# Patient Record
Sex: Female | Born: 1970 | Race: Black or African American | Hispanic: No | Marital: Single | Smoking: Former smoker
Health system: Southern US, Community
[De-identification: ages and names within clinical notes are randomized; demographics above are authoritative.]

## PROBLEM LIST (undated history)

## (undated) DIAGNOSIS — R51 Headache: Secondary | ICD-10-CM

## (undated) HISTORY — PX: CHOLECYSTECTOMY: SHX55

## (undated) HISTORY — PX: REDUCTION MAMMAPLASTY: SUR839

## (undated) HISTORY — PX: BREAST SURGERY: SHX581

## (undated) HISTORY — DX: Headache: R51

## (undated) SURGERY — BREAST REDUCTION WITH LIPOSUCTION
Anesthesia: General | Laterality: Bilateral

---

## 1998-01-20 DIAGNOSIS — R519 Headache, unspecified: Secondary | ICD-10-CM

## 1998-01-20 HISTORY — DX: Headache, unspecified: R51.9

## 2002-04-27 ENCOUNTER — Emergency Department (HOSPITAL_COMMUNITY): Admission: EM | Admit: 2002-04-27 | Discharge: 2002-04-27 | Payer: Self-pay | Admitting: Emergency Medicine

## 2002-04-27 ENCOUNTER — Encounter: Payer: Self-pay | Admitting: Emergency Medicine

## 2002-06-16 ENCOUNTER — Encounter: Payer: Self-pay | Admitting: *Deleted

## 2002-06-16 ENCOUNTER — Emergency Department (HOSPITAL_COMMUNITY): Admission: EM | Admit: 2002-06-16 | Discharge: 2002-06-16 | Payer: Self-pay | Admitting: *Deleted

## 2002-08-23 ENCOUNTER — Emergency Department (HOSPITAL_COMMUNITY): Admission: EM | Admit: 2002-08-23 | Discharge: 2002-08-23 | Payer: Self-pay | Admitting: Emergency Medicine

## 2002-08-24 ENCOUNTER — Ambulatory Visit (HOSPITAL_COMMUNITY): Admission: RE | Admit: 2002-08-24 | Discharge: 2002-08-24 | Payer: Self-pay | Admitting: Psychiatry

## 2002-08-24 ENCOUNTER — Encounter: Payer: Self-pay | Admitting: Emergency Medicine

## 2003-11-25 ENCOUNTER — Emergency Department (HOSPITAL_COMMUNITY): Admission: EM | Admit: 2003-11-25 | Discharge: 2003-11-25 | Payer: Self-pay | Admitting: Emergency Medicine

## 2004-01-23 ENCOUNTER — Ambulatory Visit: Payer: Self-pay | Admitting: Family Medicine

## 2004-07-24 ENCOUNTER — Ambulatory Visit: Payer: Self-pay | Admitting: Family Medicine

## 2004-10-24 ENCOUNTER — Ambulatory Visit: Payer: Self-pay | Admitting: Family Medicine

## 2004-10-29 ENCOUNTER — Ambulatory Visit (HOSPITAL_COMMUNITY): Admission: RE | Admit: 2004-10-29 | Discharge: 2004-10-29 | Payer: Self-pay | Admitting: Family Medicine

## 2004-11-06 ENCOUNTER — Emergency Department (HOSPITAL_COMMUNITY): Admission: EM | Admit: 2004-11-06 | Discharge: 2004-11-06 | Payer: Self-pay | Admitting: Emergency Medicine

## 2005-06-12 ENCOUNTER — Ambulatory Visit: Payer: Self-pay | Admitting: Family Medicine

## 2006-03-10 ENCOUNTER — Ambulatory Visit: Payer: Self-pay | Admitting: Family Medicine

## 2006-03-10 ENCOUNTER — Ambulatory Visit (HOSPITAL_COMMUNITY): Admission: RE | Admit: 2006-03-10 | Discharge: 2006-03-10 | Payer: Self-pay | Admitting: Family Medicine

## 2006-03-10 LAB — CONVERTED CEMR LAB: hCG, Beta Chain, Quant, S: 11444.3 milliintl units/mL

## 2006-04-20 ENCOUNTER — Ambulatory Visit: Payer: Self-pay | Admitting: Family Medicine

## 2006-04-20 LAB — CONVERTED CEMR LAB
BUN: 10 mg/dL (ref 6–23)
Basophils Absolute: 0 10*3/uL (ref 0.0–0.1)
Basophils Relative: 1 % (ref 0–1)
CO2: 24 meq/L (ref 19–32)
Calcium: 9.2 mg/dL (ref 8.4–10.5)
Chloride: 108 meq/L (ref 96–112)
Cholesterol: 106 mg/dL (ref 0–200)
Creatinine, Ser: 0.83 mg/dL (ref 0.40–1.20)
Eosinophils Absolute: 0.1 10*3/uL (ref 0.0–0.7)
Eosinophils Relative: 1 % (ref 0–5)
Glucose, Bld: 79 mg/dL (ref 70–99)
HCT: 35.8 % — ABNORMAL LOW (ref 36.0–46.0)
HDL: 47 mg/dL (ref >39)
Hemoglobin: 11.4 g/dL — ABNORMAL LOW (ref 12.0–15.0)
LDL Cholesterol: 48 mg/dL (ref 0–99)
Lymphocytes Relative: 32 % (ref 12–46)
Lymphs Abs: 1.9 10*3/uL (ref 0.7–3.3)
MCHC: 31.8 g/dL (ref 30.0–36.0)
MCV: 78 fL (ref 78.0–100.0)
Monocytes Absolute: 0.6 10*3/uL (ref 0.2–0.7)
Monocytes Relative: 10 % (ref 3–11)
Neutro Abs: 3.4 10*3/uL (ref 1.7–7.7)
Neutrophils Relative %: 57 % (ref 43–77)
Platelets: 288 10*3/uL (ref 150–400)
Potassium: 4.1 meq/L (ref 3.5–5.3)
RBC: 4.59 M/uL (ref 3.87–5.11)
RDW: 14.8 % — ABNORMAL HIGH (ref 11.5–14.0)
Sodium: 143 meq/L (ref 135–145)
TSH: 2.571 microintl units/mL (ref 0.350–5.50)
Total CHOL/HDL Ratio: 2.3
Triglycerides: 53 mg/dL (ref <150)
VLDL: 11 mg/dL (ref 0–40)
WBC: 6 10*3/uL (ref 4.0–10.5)
hCG, Beta Chain, Quant, S: 2.6 milliintl units/mL

## 2007-01-21 ENCOUNTER — Encounter: Payer: Self-pay | Admitting: Family Medicine

## 2007-07-16 ENCOUNTER — Encounter: Payer: Self-pay | Admitting: Family Medicine

## 2007-07-16 ENCOUNTER — Other Ambulatory Visit: Admission: RE | Admit: 2007-07-16 | Discharge: 2007-07-16 | Payer: Self-pay | Admitting: Family Medicine

## 2007-07-16 ENCOUNTER — Ambulatory Visit: Payer: Self-pay | Admitting: Family Medicine

## 2007-07-16 LAB — CONVERTED CEMR LAB
BUN: 7 mg/dL (ref 6–23)
Basophils Absolute: 0 10*3/uL (ref 0.0–0.1)
Basophils Relative: 0 % (ref 0–1)
CO2: 25 meq/L (ref 19–32)
Calcium: 8.9 mg/dL (ref 8.4–10.5)
Chloride: 106 meq/L (ref 96–112)
Cholesterol: 84 mg/dL (ref 0–200)
Creatinine, Ser: 0.72 mg/dL (ref 0.40–1.20)
Eosinophils Absolute: 0.1 10*3/uL (ref 0.0–0.7)
Eosinophils Relative: 1 % (ref 0–5)
Glucose, Bld: 84 mg/dL (ref 70–99)
HCT: 38.1 % (ref 36.0–46.0)
HDL: 50 mg/dL (ref >39)
Hemoglobin: 11.9 g/dL — ABNORMAL LOW (ref 12.0–15.0)
LDL Cholesterol: 24 mg/dL (ref 0–99)
Lymphocytes Relative: 28 % (ref 12–46)
Lymphs Abs: 1.5 10*3/uL (ref 0.7–4.0)
MCHC: 31.2 g/dL (ref 30.0–36.0)
MCV: 83.9 fL (ref 78.0–100.0)
Monocytes Absolute: 0.5 10*3/uL (ref 0.1–1.0)
Monocytes Relative: 8 % (ref 3–12)
Neutro Abs: 3.4 10*3/uL (ref 1.7–7.7)
Neutrophils Relative %: 62 % (ref 43–77)
Platelets: 264 10*3/uL (ref 150–400)
Potassium: 4.8 meq/L (ref 3.5–5.3)
RBC: 4.54 M/uL (ref 3.87–5.11)
RDW: 14.5 % (ref 11.5–15.5)
Sodium: 142 meq/L (ref 135–145)
Total CHOL/HDL Ratio: 1.7
Triglycerides: 49 mg/dL (ref <150)
VLDL: 10 mg/dL (ref 0–40)
WBC: 5.5 10*3/uL (ref 4.0–10.5)

## 2007-07-17 ENCOUNTER — Encounter: Payer: Self-pay | Admitting: Family Medicine

## 2007-07-17 LAB — CONVERTED CEMR LAB
Candida species: NEGATIVE
Chlamydia, DNA Probe: NEGATIVE
GC Probe Amp, Genital: NEGATIVE
Gardnerella vaginalis: POSITIVE — AB
Trichomonal Vaginitis: NEGATIVE

## 2007-07-27 ENCOUNTER — Ambulatory Visit: Payer: Self-pay | Admitting: Family Medicine

## 2007-07-27 ENCOUNTER — Ambulatory Visit (HOSPITAL_COMMUNITY): Admission: RE | Admit: 2007-07-27 | Discharge: 2007-07-27 | Payer: Self-pay | Admitting: Family Medicine

## 2007-12-24 ENCOUNTER — Ambulatory Visit: Payer: Self-pay | Admitting: Family Medicine

## 2007-12-24 DIAGNOSIS — F329 Major depressive disorder, single episode, unspecified: Secondary | ICD-10-CM

## 2007-12-27 ENCOUNTER — Encounter: Payer: Self-pay | Admitting: Family Medicine

## 2008-01-06 ENCOUNTER — Telehealth: Payer: Self-pay | Admitting: Family Medicine

## 2008-04-17 ENCOUNTER — Ambulatory Visit: Payer: Self-pay | Admitting: Family Medicine

## 2008-04-17 DIAGNOSIS — J309 Allergic rhinitis, unspecified: Secondary | ICD-10-CM | POA: Insufficient documentation

## 2008-04-18 ENCOUNTER — Encounter: Payer: Self-pay | Admitting: Family Medicine

## 2008-04-18 LAB — CONVERTED CEMR LAB
Free T4: 0.92 ng/dL (ref 0.89–1.80)
T3, Free: 2.9 pg/mL (ref 2.3–4.2)

## 2008-04-19 LAB — CONVERTED CEMR LAB: TSH: 4.592 microintl units/mL — ABNORMAL HIGH (ref 0.350–4.500)

## 2008-04-24 ENCOUNTER — Ambulatory Visit (HOSPITAL_COMMUNITY): Admission: RE | Admit: 2008-04-24 | Discharge: 2008-04-24 | Payer: Self-pay | Admitting: Family Medicine

## 2008-04-26 ENCOUNTER — Telehealth: Payer: Self-pay | Admitting: Family Medicine

## 2008-04-27 ENCOUNTER — Telehealth: Payer: Self-pay | Admitting: Family Medicine

## 2008-07-03 ENCOUNTER — Other Ambulatory Visit: Admission: RE | Admit: 2008-07-03 | Discharge: 2008-07-03 | Payer: Self-pay | Admitting: Family Medicine

## 2008-07-03 ENCOUNTER — Ambulatory Visit: Payer: Self-pay | Admitting: Family Medicine

## 2008-07-03 ENCOUNTER — Encounter: Payer: Self-pay | Admitting: Family Medicine

## 2008-07-03 DIAGNOSIS — E669 Obesity, unspecified: Secondary | ICD-10-CM

## 2008-07-03 DIAGNOSIS — N771 Vaginitis, vulvitis and vulvovaginitis in diseases classified elsewhere: Secondary | ICD-10-CM

## 2008-07-03 DIAGNOSIS — D259 Leiomyoma of uterus, unspecified: Secondary | ICD-10-CM

## 2008-07-03 DIAGNOSIS — R109 Unspecified abdominal pain: Secondary | ICD-10-CM | POA: Insufficient documentation

## 2008-07-03 LAB — CONVERTED CEMR LAB
BUN: 9 mg/dL (ref 6–23)
Basophils Absolute: 0 10*3/uL (ref 0.0–0.1)
Basophils Relative: 0 % (ref 0–1)
CO2: 22 meq/L (ref 19–32)
Calcium: 9.2 mg/dL (ref 8.4–10.5)
Chlamydia, DNA Probe: NEGATIVE
Chloride: 106 meq/L (ref 96–112)
Cholesterol: 103 mg/dL (ref 0–200)
Creatinine, Ser: 0.72 mg/dL (ref 0.40–1.20)
Eosinophils Absolute: 0.1 10*3/uL (ref 0.0–0.7)
Eosinophils Relative: 1 % (ref 0–5)
GC Probe Amp, Genital: NEGATIVE
Glucose, Bld: 86 mg/dL (ref 70–99)
HCT: 38.4 % (ref 36.0–46.0)
HDL: 45 mg/dL (ref >39)
Hemoglobin: 11.9 g/dL — ABNORMAL LOW (ref 12.0–15.0)
LDL Cholesterol: 45 mg/dL (ref 0–99)
Lymphocytes Relative: 27 % (ref 12–46)
Lymphs Abs: 1.8 10*3/uL (ref 0.7–4.0)
MCHC: 31 g/dL (ref 30.0–36.0)
MCV: 83.3 fL (ref 78.0–100.0)
Monocytes Absolute: 0.4 10*3/uL (ref 0.1–1.0)
Monocytes Relative: 6 % (ref 3–12)
Neutro Abs: 4.4 10*3/uL (ref 1.7–7.7)
Neutrophils Relative %: 65 % (ref 43–77)
Platelets: 243 10*3/uL (ref 150–400)
Potassium: 4.1 meq/L (ref 3.5–5.3)
RBC: 4.61 M/uL (ref 3.87–5.11)
RDW: 15.2 % (ref 11.5–15.5)
Sodium: 141 meq/L (ref 135–145)
Total CHOL/HDL Ratio: 2.3
Triglycerides: 66 mg/dL (ref <150)
VLDL: 13 mg/dL (ref 0–40)
WBC: 6.7 10*3/uL (ref 4.0–10.5)

## 2008-07-04 ENCOUNTER — Encounter: Payer: Self-pay | Admitting: Family Medicine

## 2008-07-04 LAB — CONVERTED CEMR LAB
Candida species: NEGATIVE
Gardnerella vaginalis: NEGATIVE
Trichomonal Vaginitis: NEGATIVE

## 2008-07-06 ENCOUNTER — Ambulatory Visit (HOSPITAL_COMMUNITY): Admission: RE | Admit: 2008-07-06 | Discharge: 2008-07-06 | Payer: Self-pay | Admitting: Family Medicine

## 2008-07-10 ENCOUNTER — Telehealth: Payer: Self-pay | Admitting: Family Medicine

## 2008-07-21 ENCOUNTER — Encounter: Payer: Self-pay | Admitting: Family Medicine

## 2008-08-30 ENCOUNTER — Encounter: Payer: Self-pay | Admitting: Family Medicine

## 2008-10-09 ENCOUNTER — Other Ambulatory Visit (HOSPITAL_COMMUNITY): Admission: RE | Admit: 2008-10-09 | Discharge: 2008-10-23 | Payer: Self-pay | Admitting: Psychiatry

## 2008-10-09 ENCOUNTER — Ambulatory Visit: Payer: Self-pay | Admitting: Psychiatry

## 2008-12-04 ENCOUNTER — Telehealth: Payer: Self-pay | Admitting: Family Medicine

## 2008-12-06 ENCOUNTER — Encounter (INDEPENDENT_AMBULATORY_CARE_PROVIDER_SITE_OTHER): Payer: Self-pay

## 2008-12-06 ENCOUNTER — Ambulatory Visit: Payer: Self-pay | Admitting: Family Medicine

## 2008-12-06 DIAGNOSIS — D509 Iron deficiency anemia, unspecified: Secondary | ICD-10-CM | POA: Insufficient documentation

## 2008-12-06 DIAGNOSIS — F411 Generalized anxiety disorder: Secondary | ICD-10-CM | POA: Insufficient documentation

## 2008-12-06 LAB — CONVERTED CEMR LAB: TSH: 3.667 microintl units/mL (ref 0.350–4.500)

## 2008-12-07 ENCOUNTER — Telehealth: Payer: Self-pay | Admitting: Family Medicine

## 2008-12-11 ENCOUNTER — Telehealth: Payer: Self-pay | Admitting: Family Medicine

## 2009-02-06 ENCOUNTER — Encounter (INDEPENDENT_AMBULATORY_CARE_PROVIDER_SITE_OTHER): Payer: Self-pay | Admitting: *Deleted

## 2009-08-15 ENCOUNTER — Ambulatory Visit: Payer: Self-pay | Admitting: Family Medicine

## 2009-10-24 ENCOUNTER — Encounter: Payer: Self-pay | Admitting: Physician Assistant

## 2009-10-24 ENCOUNTER — Ambulatory Visit: Payer: Self-pay | Admitting: Family Medicine

## 2009-10-24 ENCOUNTER — Other Ambulatory Visit: Admission: RE | Admit: 2009-10-24 | Discharge: 2009-10-24 | Payer: Self-pay | Admitting: Family Medicine

## 2009-10-24 DIAGNOSIS — M549 Dorsalgia, unspecified: Secondary | ICD-10-CM | POA: Insufficient documentation

## 2009-10-25 ENCOUNTER — Encounter: Payer: Self-pay | Admitting: Family Medicine

## 2009-10-25 LAB — CONVERTED CEMR LAB
Candida species: NEGATIVE
Chlamydia, DNA Probe: NEGATIVE
GC Probe Amp, Genital: NEGATIVE
Gardnerella vaginalis: NEGATIVE

## 2009-10-26 ENCOUNTER — Encounter: Payer: Self-pay | Admitting: Family Medicine

## 2009-10-26 LAB — CONVERTED CEMR LAB: Pap Smear: NEGATIVE

## 2009-12-25 ENCOUNTER — Encounter: Payer: Self-pay | Admitting: Family Medicine

## 2010-02-21 NOTE — Letter (Signed)
Summary: Work Excuse  [facility]  [address]   [place], [place] [address]   Phone: [phone]  Fax: [phone]    Today's Date: October 24, 2009  Name of Patient: Erin Forbes  The above named patient had a medical visit today. Please take this into consideration when reviewing the time away from work/school.    Special Instructions:  [ * ] None  [  ] To be off the remainder of today, returning to the normal work / school schedule tomorrow.  [  ] To be off until the next scheduled appointment on ______________________.  [  ] Other ________________________________________________________________ ________________________________________________________________________   Sincerely yours,   Syliva Overman, MD

## 2010-02-21 NOTE — Letter (Signed)
Summary: 2nd missed letter  2nd missed letter   Imported By: Lind Guest 12/26/2009 10:58:20  _____________________________________________________________________  External Attachment:    Type:   Image     Comment:   External Document

## 2010-02-21 NOTE — Letter (Signed)
Summary: labs  labs   Imported By: Lind Guest 07/10/2009 16:37:11  _____________________________________________________________________  External Attachment:    Type:   Image     Comment:   External Document

## 2010-02-21 NOTE — Progress Notes (Signed)
Summary: Office Visit  Office Visit   Imported By: Lind Guest 01/22/2009 09:43:49  _____________________________________________________________________  External Attachment:    Type:   Image     Comment:   External Document

## 2010-02-21 NOTE — Letter (Signed)
Summary: x rays  x rays   Imported By: Lind Guest 07/10/2009 16:39:21  _____________________________________________________________________  External Attachment:    Type:   Image     Comment:   External Document

## 2010-02-21 NOTE — Letter (Signed)
Summary: office notes  office notes   Imported By: Lind Guest 07/10/2009 16:38:50  _____________________________________________________________________  External Attachment:    Type:   Image     Comment:   External Document

## 2010-02-21 NOTE — Assessment & Plan Note (Signed)
Summary: physical   Vital Signs:  Patient profile:   40 year old female Menstrual status:  irregular Height:      66 inches Weight:      189.25 pounds BMI:     30.66 O2 Sat:      98 % on Room air Pulse rate:   100 / minute Pulse rhythm:   regular Resp:     16 per minute BP sitting:   120 / 80  (left arm)  Vitals Entered By: Adella Hare LPN (October 24, 2009 11:18 AM)  Nutrition Counseling: Patient's BMI is greater than 25 and therefore counseled on weight management options.  O2 Flow:  Room air CC: physical Is Patient Diabetic? No Pain Assessment Patient in pain? no        CC:  physical.  History of Present Illness: Reports  that she has been doing well. She is still not exercising daily, on average jhus twice per week. She has noted reduced apetite on the med and wants to cntinue this.She has no adverse s/e Denies recent fever or chills. Denies sinus pressure, nasal congestion , ear pain or sore throat. Denies chest congestion, or cough productive of sputum. Denies chest pain, palpitations, PND, orthopnea or leg swelling. Denies abdominal pain, nausea, vomitting, diarrhea or constipation. Denies change in bowel movements or bloody stool. Denies dysuria , frequency, incontinence or hesitancy.  Denies headaches, vertigo, seizures. Denies depression, anxiety or insomnia. Denies  rash, lesions, or itch.     Current Medications (verified): 1)  Phentermine Hcl 37.5 Mg Caps (Phentermine Hcl) .... Take 1 Tablet By Mouth Once A Day  Allergies (verified): No Known Drug Allergies  Review of Systems      See HPI MS:  Complains of low back pain; 3 week h/o disabling low back pain. Endo:  Denies excessive thirst and excessive urination. Heme:  Denies abnormal bruising and bleeding. Allergy:  Denies hives or rash and itching eyes.  Physical Exam  General:  Well-developed,well-nourished,in no acute distress; alert,appropriate and cooperative throughout  examination Head:  Normocephalic and atraumatic without obvious abnormalities. No apparent alopecia or balding. Eyes:  No corneal or conjunctival inflammation noted. EOMI. Perrla. Funduscopic exam benign, without hemorrhages, exudates or papilledema. Vision grossly normal. Ears:  External ear exam shows no significant lesions or deformities.  Otoscopic examination reveals bilateral cerumen impaction,  Hearing is grossly normal bilaterally. Nose:  External nasal examination shows no deformity or inflammation. Nasal mucosa are pink and moist without lesions or exudates. Mouth:  Oral mucosa and oropharynx without lesions or exudates.  Teeth in good repair. Neck:  No deformities, masses, or tenderness noted. Chest Wall:  No deformities, masses, or tenderness noted. Breasts:  No mass, nodules, thickening, tenderness, bulging, retraction, inflamation, nipple discharge or skin changes noted.   Lungs:  Normal respiratory effort, chest expands symmetrically. Lungs are clear to auscultation, no crackles or wheezes. Heart:  Normal rate and regular rhythm. S1 and S2 normal without gallop, murmur, click, rub or other extra sounds. Abdomen:  Bowel sounds positive,abdomen soft and non-tender without masses, organomegaly or hernias noted. Genitalia:  Normal introitus for age, no external lesions, clear vaginal discharge, mucosa pink and moist, no vaginal or cervical lesions, no vaginal atrophy, no friaility or hemorrhage, normal uterus size and position, no adnexal masses or tenderness Msk:  No deformity or scoliosis noted of thoracic or lumbar spine.   Pulses:  R and L carotid,radial,femoral,dorsalis pedis and posterior tibial pulses are full and equal bilaterally Extremities:  decreased rOM thoracolumbar spine with spasm Neurologic:  No cranial nerve deficits noted. Station and gait are normal. Plantar reflexes are down-going bilaterally. DTRs are symmetrical throughout. Sensory, motor and coordinative functions  appear intact. Skin:  Intact without suspicious lesions or rashes Cervical Nodes:  No lymphadenopathy noted Axillary Nodes:  No palpable lymphadenopathy Inguinal Nodes:  No significant adenopathy Psych:  Cognition and judgment appear intact. Alert and cooperative with normal attention span and concentration. No apparent delusions, illusions, hallucinations   Impression & Recommendations:  Problem # 1:  BACK PAIN (ICD-724.5) Assessment Deteriorated  Her updated medication list for this problem includes:    Ibuprofen 800 Mg Tabs (Ibuprofen) .Marland Kitchen... Take 1 tablet by mouth three times a day  Orders: Radiology other (Radiology Other) Depo- Medrol 80mg  (J1040) Ketorolac-Toradol 15mg  (O1308) Admin of Therapeutic Inj  intramuscular or subcutaneous (65784)  Problem # 2:  OVERWEIGHT (ICD-278.02) Assessment: Improved  Ht: 66 (10/24/2009)   Wt: 189.25 (10/24/2009)   BMI: 30.66 (10/24/2009) therapeutic lifestyle change discussed and encouraged  Problem # 3:  Preventive Health Care (ICD-V70.0) pAP SENT. CONDOM USE DISCUSSED, ALSO SEATBELT USE  Complete Medication List: 1)  Ibuprofen 800 Mg Tabs (Ibuprofen) .... Take 1 tablet by mouth three times a day 2)  Phentermine Hcl 37.5 Mg Tabs (Phentermine hcl) .... Take 1 tablet by mouth once a day  Other Orders: T-Wet Prep by Molecular Probe [id number]) T-Chlamydia & GC Probe, Genital ([id number]) Pap Smear ([address]40)  Patient Instructions: 1)  Please schedule a follow-up appointment in 2 months. 2)  It is important that you exercise regularly at least 40 minutes 6 times a week. If you develop chest pain, have severe difficulty breathing, or feel very tired , stop exercising immediately and seek medical attention. 3)  You need to lose weight. Consider a lower calorie diet and regular exercise. congrats on  6 pound weight loss.Marland Kitchen 4)  you will get injections for your back, back strengthening exercises, watch your posture and care with  heels. 5)  meds are also sent in for the back pain 6)  your urine will be checked for infection Prescriptions: PHENTERMINE HCL 37.5 MG TABS (PHENTERMINE HCL) Take 1 tablet by mouth once a day  #30 x 0   Entered and Authorized by:   Syliva Overman MD   Signed by:   Syliva Overman MD on 10/24/2009   Method used:   Printed then faxed to ...       [facility] (retail)       [address]       [place], [place]  [address]       Ph: [phone]       Fax: [phone]   RxID:   [id number] IBUPROFEN 800 MG TABS (IBUPROFEN) Take 1 tablet by mouth three times a day  #30 x 0   Entered and Authorized by:   Syliva Overman MD   Signed by:   Syliva Overman MD on 10/24/2009   Method used:   Electronically to        [facility] (retail)       [address]       [place], [place]  [address]       Ph: [phone]       Fax: [phone]   RxID:   [id number]      Medication Administration  Injection # 1:  Medication: Depo- Medrol 80mg     Diagnosis: BACK PAIN (ICD-724.5)    Route: IM    Site: RUOQ gluteus    Exp Date: 05/2010    Lot #: [id number]    Mfr: Pharmacia    Patient tolerated injection without complications    Given by: Adella Hare LPN (October 24, 2009 12:08 PM)  Injection # 2:    Medication: Ketorolac-Toradol 15mg     Diagnosis: BACK PAIN (ICD-724.5)    Route: IM    Site: LUOQ gluteus    Exp Date: 03/21/2011    Lot #: [id number]    Mfr: novaplus    Comments: toradol 60mg  given    Patient tolerated injection without complications    Given by: Adella Hare LPN (October 24, 2009 12:09 PM)  Orders Added: 1)  Est. Patient 18-39 years [99395] 2)  Radiology other [Radiology Other] 3)  Depo- Medrol 80mg  [J1040] 4)  Ketorolac-Toradol 15mg  [J1885] 5)  Admin of Therapeutic Inj  intramuscular or subcutaneous [96372] 6)  T-Wet Prep by Molecular Probe [[id number]] 7)  T-Chlamydia & GC Probe, Genital  [[id number]] 8)  Pap Smear [86578]  Appended Document: physical vision screen R 20/25 L 20/25 B 20/25

## 2010-02-21 NOTE — Letter (Signed)
Summary: 1st Missed Appt.  [facility]  [address]   [place], [place] [address]   Phone: [phone]  Fax: [phone]    February 06, 2009  MRN: [id number]  ALIEAH BRINTON [address] [place], [place]  [address]  Dear Ms. Markman,  At [facility], we make every attempt to fit patients into our schedule by reserving several appointment slots for same-day appointments.  However, we cannot always make appointments for patients the same day they are calling.  At the end of the day, we look back at our schedule and find that because of last-minute cancellations and patients not showing up for their scheduled appointments, we have several appointment slots that are left open and could have been used by another person who really needed it.  In the past, you may have been one of the patients who could not get in when you needed to.  But recently, you were one of the patients with an appointment that you didn't show up for or canceled too late for [place] to fill it.  We choose not to charge no-show or last minute cancellation fees to our patients, like many other offices do.  We do not wish to institute that policy and hope we never have to.  However, we kindly request that you assist [place] by providing at least 24 hours' notice if you can't make your appointment.  If no-shows or late cancellations become habitual (i.e. Three or more in a one-year period), we may terminate the physician-patient relationship.    Thank you for your consideration and cooperation.   Altamease Oiler

## 2010-02-21 NOTE — Letter (Signed)
Summary: history and physical  history and physical   Imported By: Lind Guest 07/10/2009 16:36:42  _____________________________________________________________________  External Attachment:    Type:   Image     Comment:   External Document

## 2010-02-21 NOTE — Letter (Signed)
Summary: Letter  Letter   Imported By: Lind Guest 10/26/2009 16:28:50  _____________________________________________________________________  External Attachment:    Type:   Image     Comment:   External Document

## 2010-02-21 NOTE — Letter (Signed)
Summary: demo  demo   Imported By: Lind Guest 07/10/2009 16:36:00  _____________________________________________________________________  External Attachment:    Type:   Image     Comment:   External Document

## 2010-02-21 NOTE — Letter (Signed)
Summary: phone notes  phone notes   Imported By: Lind Guest 07/10/2009 16:34:55  _____________________________________________________________________  External Attachment:    Type:   Image     Comment:   External Document

## 2010-02-21 NOTE — Assessment & Plan Note (Signed)
Summary: ov   Vital Signs:  Patient profile:   40 year old female Menstrual status:  irregular Height:      66 inches Weight:      195 pounds BMI:     31.59 O2 Sat:      97 % Pulse rate:   100 / minute Pulse rhythm:   regular Resp:     16 per minute BP sitting:   110 / 80  (left arm) Cuff size:   large  Vitals Entered By: Everitt Amber LPN (August 15, 2009 11:25 AM)  Nutrition Counseling: Patient's BMI is greater than 25 and therefore counseled on weight management options. CC: Follow up visit   CC:  Follow up visit.  History of Present Illness: Reports  that she has been doing well. Denies recent fever or chills. Denies sinus pressure, nasal congestion , ear pain or sore throat. Denies chest congestion, or cough productive of sputum. Denies chest pain, palpitations, PND, orthopnea or leg swelling. Denies abdominal pain, nausea, vomitting, diarrhea or constipation. Denies change in bowel movements or bloody stool. Denies dysuria , frequency, incontinence or hesitancy. Denies  joint pain, swelling, or reduced mobility. Denies headaches, vertigo, seizures. Denies depression, she does note some anxiety and difficulty with sleep esp perimenstrually. She ahs not been exercising regularly , but intends to start,a nd wants n apetite suppressant to help with weight loss. Denies  rash, lesions, or itch.      Current Medications (verified): 1)  Alprazolam 0.25 Mg Tabs (Alprazolam) .... Take 1 Tablet By Mouth Once A Day As Needed  Allergies (verified): No Known Drug Allergies  Review of Systems      See HPI General:  Complains of fatigue and sleep disorder. Eyes:  Denies blurring and discharge. Endo:  Denies cold intolerance, excessive hunger, excessive thirst, excessive urination, heat intolerance, polyuria, and weight change. Heme:  Denies abnormal bruising and bleeding. Allergy:  Denies hives or rash and itching eyes.  Physical Exam  General:  Well-developed,obese,in no  acute distress; alert,appropriate and cooperative throughout examination HEENT: No facial asymmetry,  EOMI, No sinus tenderness, TM's Clear, oropharynx  pink and moist.   Chest: Clear to auscultation bilaterally.  CVS: S1, S2, No murmurs, No S3.   Abd: Soft, Nontender.  MS: Adequate ROM spine, hips, shoulders and knees.  Ext: No edema.   CNS: CN 2-12 intact, power tone and sensation normal throughout.   Skin: Intact, no visible lesions or rashes.  Psych: Good eye contact, normal affect.  Memory intact, not anxious or depressed appearing.    Impression & Recommendations:  Problem # 1:  FATIGUE (ICD-780.79) Assessment Deteriorated  Orders: T-Basic Metabolic Panel [id number]) T-CBC w/Diff [id number]) T-TSH ([id number])  Problem # 2:  GENERALIZED ANXIETY DISORDER (ICD-300.02) Assessment: Improved  The following medications were removed from the medication list:    Alprazolam 0.25 Mg Tabs (Alprazolam) .Marland Kitchen... Take 1 tablet by mouth once a day as needed  Problem # 3:  OVERWEIGHT (ICD-278.02) Assessment: Deteriorated  Orders: T-Lipid Profile ([id number])  Ht: 66 (08/15/2009)   Wt: 195 (08/15/2009)   BMI: 31.59 (08/15/2009)  Complete Medication List: 1)  Phentermine Hcl 37.5 Mg Caps (Phentermine hcl) .... Take 1 tablet by mouth once a day  Other Orders: T-Vitamin D (25-Hydroxy) ([id number])  Patient Instructions: 1)  CPE in 2 months, Nurse visit mnext week for ear flush 2)  It is important that you exercise regularly at least 20 minutes 5 times a week. If you develop chest pain, have severe  difficulty breathing, or feel very tired , stop exercising immediately and seek medical attention. 3)  You need to lose weight. Consider a lower calorie diet and regular exercise.  4)  BMP prior to visit, ICD-9: 5)  Lipid Panel prior to visit, ICD-9:  fastingg asap 6)  TSH prior to visit, ICD-9: 7)  CBC w/ Diff prior to visit, ICD-9: 8)  Vitamin D Prescriptions: PHENTERMINE HCL  37.5 MG CAPS (PHENTERMINE HCL) Take 1 tablet by mouth once a day  #30 x 0   Entered and Authorized by:   Syliva Overman MD   Signed by:   Syliva Overman MD on 08/15/2009   Method used:   Printed then faxed to ...       [facility] [id number] (retail)       [address]       [place], [place]  [address]       Ph: [phone] or [phone]       Fax: [phone]   RxID:   [id number]

## 2010-02-21 NOTE — Letter (Signed)
Summary: misc  misc   Imported By: Lind Guest 07/10/2009 16:37:42  _____________________________________________________________________  External Attachment:    Type:   Image     Comment:   External Document

## 2010-02-21 NOTE — Letter (Signed)
Summary: consults  consults   Imported By: Lind Guest 07/10/2009 16:35:25  _____________________________________________________________________  External Attachment:    Type:   Image     Comment:   External Document

## 2011-07-18 ENCOUNTER — Emergency Department (HOSPITAL_COMMUNITY): Payer: Managed Care, Other (non HMO)

## 2011-07-18 ENCOUNTER — Emergency Department (HOSPITAL_COMMUNITY)
Admission: EM | Admit: 2011-07-18 | Discharge: 2011-07-18 | Disposition: A | Discharge status: Home / Self Care | Payer: Managed Care, Other (non HMO) | Attending: Emergency Medicine | Admitting: Emergency Medicine

## 2011-07-18 ENCOUNTER — Telehealth: Payer: Self-pay | Admitting: Family Medicine

## 2011-07-18 ENCOUNTER — Encounter (HOSPITAL_COMMUNITY): Payer: Self-pay | Admitting: *Deleted

## 2011-07-18 DIAGNOSIS — R42 Dizziness and giddiness: Secondary | ICD-10-CM | POA: Insufficient documentation

## 2011-07-18 DIAGNOSIS — R079 Chest pain, unspecified: Secondary | ICD-10-CM | POA: Insufficient documentation

## 2011-07-18 DIAGNOSIS — R0602 Shortness of breath: Secondary | ICD-10-CM | POA: Insufficient documentation

## 2011-07-18 DIAGNOSIS — R51 Headache: Secondary | ICD-10-CM | POA: Insufficient documentation

## 2011-07-18 LAB — CBC
MCH: 26.3 pg (ref 26.0–34.0)
MCV: 79.7 fL (ref 78.0–100.0)
Platelets: 268 10*3/uL (ref 150–400)
RDW: 14 % (ref 11.5–15.5)
WBC: 5.6 10*3/uL (ref 4.0–10.5)

## 2011-07-18 LAB — CARDIAC PANEL(CRET KIN+CKTOT+MB+TROPI): Total CK: 101 U/L (ref 7–177)

## 2011-07-18 LAB — BASIC METABOLIC PANEL
Calcium: 9.9 mg/dL (ref 8.4–10.5)
Creatinine, Ser: 0.71 mg/dL (ref 0.50–1.10)
GFR calc Af Amer: >90 mL/min (ref >90)
GFR calc non Af Amer: >90 mL/min (ref >90)

## 2011-07-18 MED ORDER — OMEPRAZOLE 20 MG PO CPDR: 20.0000 mg | DELAYED_RELEASE_CAPSULE | Freq: Every day | ORAL | Status: DC

## 2011-07-18 MED ORDER — GI COCKTAIL ~~LOC~~
30.0000 mL | Freq: Once | ORAL | Status: AC
  Administered 2011-07-18: 30 mL via ORAL
  Filled 2011-07-18: qty 30

## 2011-07-18 MED ORDER — IOHEXOL 350 MG/ML SOLN
100.0000 mL | Freq: Once | INTRAVENOUS | Status: AC | PRN
  Administered 2011-07-18: 100 mL via INTRAVENOUS

## 2011-07-18 NOTE — ED Notes (Signed)
Headache, rt  side of head, onset yesterday, and feels "off balance".  Chest pain this am.  Nausea, sl sob.

## 2011-07-18 NOTE — ED Provider Notes (Signed)
History   This chart was scribed for Glynn Octave, MD by Shari Heritage. The patient was seen in room APA09/APA09. Patient's care was started at 1117.     CSN: [id number]  Arrival date & time 07/18/11  1117   First MD Initiated Contact with Patient 07/18/11 1207      Chief Complaint  Patient presents with  . Chest Pain    (Consider location/radiation/quality/duration/timing/severity/associated sxs/prior treatment) HPI Erin Forbes is a 41 y.o. female who presents to the Emergency Department complaining of a sudden onset chest pain that began 4 hours ago and a recurrent temporal HA onset 1 month ago. Associated symptoms include dizziness, nausea, heaviness in her left arm, and SOB. Patient is experiencing these symptoms right now. Patient describes her headache as throbbing and pressure. Patient says that light and noise don't bother her, but she has experienced ringing in her ears. Patient says that her HAs come on gradually. Patient usually doesn't take any medications to relieve HAs. Patient denies h/o HTN, diabetes, heart problems. Patient has never had a stress test. Patient has never smoked. Patient is not currently taking any medications.  PCP - Lodema Hong  History reviewed. No pertinent past medical history.  Past Surgical History  Procedure Date  . Cesarean section     History reviewed. No pertinent family history.  History  Substance Use Topics  . Smoking status: Never Smoker   . Smokeless tobacco: Not on file  . Alcohol Use: Yes    OB History    Grav Para Term Preterm Abortions TAB SAB Ect Mult Living                  Review of Systems A complete 10 system review of systems was obtained and all systems are negative except as noted in the HPI and PMH.   Allergies  Review of patient's allergies indicates no known allergies.  Home Medications   Current Outpatient Rx  Name Route Sig Dispense Refill  . OMEPRAZOLE 20 MG PO CPDR Oral Take 1 capsule (20 mg  total) by mouth daily. 30 capsule 0    BP 112/73  Pulse 63  Temp 98.2 F (36.8 C) (Oral)  Resp 14  Ht 5\' 6"  (1.676 m)  Wt 194 lb (87.998 kg)  BMI 31.31 kg/m2  SpO2 99%  LMP 07/02/2011  Physical Exam  Nursing note and vitals reviewed. Constitutional: She is oriented to person, place, and time. She appears well-developed and well-nourished.  HENT:  Head: Normocephalic and atraumatic.  Eyes: Conjunctivae and EOM are normal. Pupils are equal, round, and reactive to light.  Neck: Normal range of motion. Neck supple.  Cardiovascular: Normal rate, regular rhythm and normal heart sounds.   No murmur heard. Pulmonary/Chest: Effort normal and breath sounds normal. No respiratory distress. She has no wheezes.       No chest pain to palpation.  Abdominal: Soft. Bowel sounds are normal.  Musculoskeletal: Normal range of motion.  Neurological: She is alert and oriented to person, place, and time.  Skin: Skin is warm and dry.  Psychiatric: She has a normal mood and affect.    ED Course  Procedures (including critical care time) DIAGNOSTIC STUDIES: Oxygen Saturation is 100% on room air, normal by my interpretation.    COORDINATION OF CARE: 12:14PM- Patient informed of current plan for treatment and evaluation and agrees with plan at this time.     Labs Reviewed  BASIC METABOLIC PANEL - Abnormal; Notable for the following:  Potassium 3.1 (*)     All other components within normal limits  D-DIMER, QUANTITATIVE - Abnormal; Notable for the following:    D-Dimer, Quant 0.50 (*)     All other components within normal limits  CBC  CARDIAC PANEL(CRET KIN+CKTOT+MB+TROPI)   Dg Chest 2 View  07/18/2011  *RADIOLOGY REPORT*  Clinical Data: Chest and left arm pain.  Nausea.  Dizziness.  CHEST - 2 VIEW  Comparison:  11/06/2004  Findings:  The heart size and mediastinal contours are within normal limits.  Both lungs are clear.  The visualized skeletal structures are unremarkable.  IMPRESSION:  No active cardiopulmonary disease.  Original Report Authenticated By: Danae Orleans, M.D.   Ct Angio Chest W/cm &/or Wo Cm  07/18/2011  *RADIOLOGY REPORT*  Clinical Data: Chest pain  CT ANGIOGRAPHY CHEST  Technique:  Multidetector CT imaging of the chest using the standard protocol during bolus administration of intravenous contrast. Multiplanar reconstructed images including MIPs were obtained and reviewed to evaluate the vascular anatomy.  Contrast: OMNIPAQUE IOHEXOL 350 MG/ML SOLN  Comparison: 11/06/2004  Findings: The pulmonary arteries are patent.  No evidence for acute embolus. There is no supraclavicular or axillary adenopathy.  No mediastinal or hilar adenopathy.  No pericardial or pleural effusion.  No airspace consolidation.  No suspicious pulmonary parenchymal nodule or mass identified.  No interstitial edema.  The tracheobronchial tree appears normal.  Review of the visualized bony structures is unremarkable.  Limited imaging through the upper abdomen is unremarkable.  IMPRESSION:  1.  No acute cardiopulmonary abnormalities.  No evidence for pulmonary embolus.  Original Report Authenticated By: Rosealee Albee, M.D.     1. Chest pain       MDM  Presents from work with 4 hours of constant chest pressure, headache, SOB, nausea.  Started at 830 am.  States has been having frequent headaches for the past month.  Atypical for ACS. No cardiac risk factors.  EKG nonischemic, troponin negative.  Ddimer slightly elevated.  CTPE negative.  Symptoms have resolved in ED.  Patient joking with family.  Reassured that symptoms do not appear to be cardiac.  Stable for outpatient followup.   Date: 07/18/2011  Rate: 63  Rhythm: normal sinus rhythm  QRS Axis: normal  Intervals: normal  ST/T Wave abnormalities: normal  Conduction Disutrbances:none  Narrative Interpretation:   Old EKG Reviewed: unchanged     I personally performed the services described in this documentation, which  was scribed in my presence.  The recorded information has been reviewed and considered.    Glynn Octave, MD 07/18/11 [phone]

## 2011-07-18 NOTE — Discharge Instructions (Signed)
Chest Pain (Nonspecific) There is no evidence of heart attack or blood clot in the lung.  Follow up with your doctor.  Return to the ED if you develop new or worsening symptoms. It is often hard to give a specific diagnosis for the cause of chest pain. There is always a chance that your pain could be related to something serious, such as a heart attack or a blood clot in the lungs. You need to follow up with your caregiver for further evaluation. CAUSES   Heartburn.   Pneumonia or bronchitis.   Anxiety or stress.   Inflammation around your heart (pericarditis) or lung (pleuritis or pleurisy).   A blood clot in the lung.   A collapsed lung (pneumothorax). It can develop suddenly on its own (spontaneous pneumothorax) or from injury (trauma) to the chest.   Shingles infection (herpes zoster virus).  The chest wall is composed of bones, muscles, and cartilage. Any of these can be the source of the pain.  The bones can be bruised by injury.   The muscles or cartilage can be strained by coughing or overwork.   The cartilage can be affected by inflammation and become sore (costochondritis).  DIAGNOSIS  Lab tests or other studies, such as X-rays, electrocardiography, stress testing, or cardiac imaging, may be needed to find the cause of your pain.  TREATMENT   Treatment depends on what may be causing your chest pain. Treatment may include:   Acid blockers for heartburn.   Anti-inflammatory medicine.   Pain medicine for inflammatory conditions.   Antibiotics if an infection is present.   You may be advised to change lifestyle habits. This includes stopping smoking and avoiding alcohol, caffeine, and chocolate.   You may be advised to keep your head raised (elevated) when sleeping. This reduces the chance of acid going backward from your stomach into your esophagus.   Most of the time, nonspecific chest pain will improve within 2 to 3 days with rest and mild pain medicine.  HOME  CARE INSTRUCTIONS   If antibiotics were prescribed, take your antibiotics as directed. Finish them even if you start to feel better.   For the next few days, avoid physical activities that bring on chest pain. Continue physical activities as directed.   Do not smoke.   Avoid drinking alcohol.   Only take over-the-counter or prescription medicine for pain, discomfort, or fever as directed by your caregiver.   Follow your caregiver's suggestions for further testing if your chest pain does not go away.   Keep any follow-up appointments you made. If you do not go to an appointment, you could develop lasting (chronic) problems with pain. If there is any problem keeping an appointment, you must call to reschedule.  SEEK MEDICAL CARE IF:   You think you are having problems from the medicine you are taking. Read your medicine instructions carefully.   Your chest pain does not go away, even after treatment.   You develop a rash with blisters on your chest.  SEEK IMMEDIATE MEDICAL CARE IF:   You have increased chest pain or pain that spreads to your arm, neck, jaw, back, or abdomen.   You develop shortness of breath, an increasing cough, or you are coughing up blood.   You have severe back or abdominal pain, feel nauseous, or vomit.   You develop severe weakness, fainting, or chills.   You have a fever.  THIS IS AN EMERGENCY. Do not wait to see if the pain will  go away. Get medical help at once. Call your local emergency services (911 in [place] Do not drive yourself to the hospital. MAKE SURE YOU:   Understand these instructions.   Will watch your condition.   Will get help right away if you are not doing well or get worse.  Document Released: 10/16/2004 Document Revised: 12/26/2010 Document Reviewed: 08/12/2007 [facility] Patient Information 2012 [place], [place].

## 2011-07-18 NOTE — Telephone Encounter (Signed)
It is ok for me to pick her back up

## 2011-07-22 ENCOUNTER — Encounter: Payer: Self-pay | Admitting: Family Medicine

## 2011-07-22 ENCOUNTER — Telehealth: Payer: Self-pay | Admitting: Family Medicine

## 2011-07-22 ENCOUNTER — Other Ambulatory Visit: Payer: Self-pay | Admitting: Family Medicine

## 2011-07-22 ENCOUNTER — Ambulatory Visit (INDEPENDENT_AMBULATORY_CARE_PROVIDER_SITE_OTHER): Payer: Managed Care, Other (non HMO) | Admitting: Family Medicine

## 2011-07-22 ENCOUNTER — Other Ambulatory Visit: Payer: Self-pay

## 2011-07-22 VITALS — BP 148/96 | HR 76 | Resp 18 | Ht 66.0 in | Wt 195.0 lb

## 2011-07-22 DIAGNOSIS — R5381 Other malaise: Secondary | ICD-10-CM

## 2011-07-22 DIAGNOSIS — G43009 Migraine without aura, not intractable, without status migrainosus: Secondary | ICD-10-CM | POA: Insufficient documentation

## 2011-07-22 DIAGNOSIS — Z13220 Encounter for screening for lipoid disorders: Secondary | ICD-10-CM

## 2011-07-22 DIAGNOSIS — F3289 Other specified depressive episodes: Secondary | ICD-10-CM

## 2011-07-22 DIAGNOSIS — R51 Headache: Secondary | ICD-10-CM

## 2011-07-22 DIAGNOSIS — Z139 Encounter for screening, unspecified: Secondary | ICD-10-CM

## 2011-07-22 DIAGNOSIS — F329 Major depressive disorder, single episode, unspecified: Secondary | ICD-10-CM

## 2011-07-22 DIAGNOSIS — I10 Essential (primary) hypertension: Secondary | ICD-10-CM

## 2011-07-22 DIAGNOSIS — R5383 Other fatigue: Secondary | ICD-10-CM

## 2011-07-22 DIAGNOSIS — E663 Overweight: Secondary | ICD-10-CM

## 2011-07-22 MED ORDER — TRIAMTERENE-HCTZ 37.5-25 MG PO TABS: ORAL_TABLET | ORAL | Status: DC

## 2011-07-22 NOTE — Progress Notes (Signed)
  Subjective:    Patient ID: Erin Forbes, female    DOB: October 18, 1970, 41 y.o.   MRN: [id number]  HPI The PT is here to re establish care and re-evaluation of chronic medical conditions, medication management and review of any available recent lab and radiology data.  Preventive health is updated, specifically  Cancer screening and Immunization.   Rxcently seen in the Ed for chest pain, at that time her blood pressure was high Has a 1 month h/o headaches, increased stress, poor sleep, excessive symptoms of depression, overwhelmed with financial responsibilities which she is unable to meet and poor living conditions. Not suicidal or homicidal, no interest in medication or therapy     Review of Systems See HPI Denies recent fever or chills. Denies sinus pressure, nasal congestion, ear pain or sore throat. Denies chest congestion, productive cough or wheezing. Denies pND , orthopnea, palpitations and leg swelling Denies abdominal pain, nausea, vomiting,diarrhea or constipation.   Denies dysuria, frequency, hesitancy or incontinence. Denies joint pain, swelling and limitation in mobility. Denies seizures, numbness, or tingling.  Denies skin break down or rash.        Objective:   Physical Exam  Patient alert and oriented and in no cardiopulmonary distress.  HEENT: No facial asymmetry, EOMI, no sinus tenderness,  oropharynx pink and moist.  Neck supple no adenopathy.  Chest: Clear to auscultation bilaterally.  CVS: S1, S2 no murmurs, no S3.  ABD: Soft non tender. Bowel sounds normal.  Ext: No edema  MS: Adequate ROM spine, shoulders, hips and knees.  Skin: Intact, no ulcerations or rash noted.  Psych: Good eye contact, normal affect. Memory intact tearful and mildly depressed appearing.  CNS: CN 2-12 intact, power, tone and sensation normal throughout.       Assessment & Plan:

## 2011-07-22 NOTE — Patient Instructions (Addendum)
CPE in 4 to 5 weeks.  New medication for high blood pressure, start today, take at same time every day, HALF maxzide daily  Fasting lipid, chem 7, HBA1C and TSH in 3 to 5 days before next visit.  A healthy diet is rich in fruit, vegetables and whole grains. Poultry fish, nuts and beans are a healthy choice for protein rather then red meat. A low sodium diet and drinking 64 ounces of water daily is generally recommended. Oils and sweet should be limited. Carbohydrates especially for those who are diabetic or overweight, should be limited to 34-45 gram per meal. It is important to eat on a regular schedule, at least 3 times daily. Snacks should be primarily fruits, vegetables or nuts.   It is important that you exercise regularly at least 45 minutes 5 times a week. If you develop chest pain, have severe difficulty breathing, or feel very tired, stop exercising immediately and seek medical attention    Mammogram will be scheduled at exit

## 2011-07-22 NOTE — Assessment & Plan Note (Signed)
New dx counseled re the importance of regular exercise, weigh loss and dietary change in blood pressure management. She is to start maxzide half daily

## 2011-07-22 NOTE — Telephone Encounter (Signed)
Med resent to correct pharmacy  

## 2011-07-22 NOTE — Assessment & Plan Note (Signed)
Deteriorated. Patient re-educated about  the importance of commitment to a  minimum of 150 minutes of exercise per week. The importance of healthy food choices with portion control discussed. Encouraged to start a food diary, count calories and to consider  joining a support group. Sample diet sheets offered. Goals set by the patient for the next several months.    

## 2011-07-22 NOTE — Assessment & Plan Note (Signed)
Mild priamrily related to situation, no medication at this time

## 2011-07-22 NOTE — Assessment & Plan Note (Signed)
Improved since Ed visit, related too poor sleep and stress primarily, no focal deficits on neurological eval, tylenol as needed

## 2011-07-29 ENCOUNTER — Ambulatory Visit (HOSPITAL_COMMUNITY)
Admission: RE | Admit: 2011-07-29 | Discharge: 2011-07-29 | Disposition: A | Discharge status: Home / Self Care | Payer: Managed Care, Other (non HMO) | Source: Ambulatory Visit | Attending: Family Medicine | Admitting: Family Medicine

## 2011-07-29 DIAGNOSIS — Z1231 Encounter for screening mammogram for malignant neoplasm of breast: Secondary | ICD-10-CM | POA: Insufficient documentation

## 2011-07-29 DIAGNOSIS — N63 Unspecified lump in unspecified breast: Secondary | ICD-10-CM | POA: Insufficient documentation

## 2011-07-29 DIAGNOSIS — Z139 Encounter for screening, unspecified: Secondary | ICD-10-CM

## 2011-08-04 ENCOUNTER — Other Ambulatory Visit: Payer: Self-pay | Admitting: Family Medicine

## 2011-08-04 DIAGNOSIS — R928 Other abnormal and inconclusive findings on diagnostic imaging of breast: Secondary | ICD-10-CM

## 2011-08-06 ENCOUNTER — Ambulatory Visit (HOSPITAL_COMMUNITY)
Admission: RE | Admit: 2011-08-06 | Discharge: 2011-08-06 | Disposition: A | Discharge status: Home / Self Care | Payer: Managed Care, Other (non HMO) | Source: Ambulatory Visit | Attending: Family Medicine | Admitting: Family Medicine

## 2011-08-06 ENCOUNTER — Other Ambulatory Visit (HOSPITAL_COMMUNITY): Payer: Self-pay | Admitting: Family Medicine

## 2011-08-06 DIAGNOSIS — R928 Other abnormal and inconclusive findings on diagnostic imaging of breast: Secondary | ICD-10-CM

## 2011-08-06 DIAGNOSIS — N6009 Solitary cyst of unspecified breast: Secondary | ICD-10-CM | POA: Insufficient documentation

## 2011-08-13 ENCOUNTER — Encounter (HOSPITAL_COMMUNITY): Payer: Managed Care, Other (non HMO)

## 2011-08-25 ENCOUNTER — Other Ambulatory Visit (HOSPITAL_COMMUNITY)
Admission: RE | Admit: 2011-08-25 | Discharge: 2011-08-25 | Disposition: A | Discharge status: Home / Self Care | Payer: Managed Care, Other (non HMO) | Source: Ambulatory Visit | Attending: Family Medicine | Admitting: Family Medicine

## 2011-08-25 ENCOUNTER — Encounter: Payer: Self-pay | Admitting: Family Medicine

## 2011-08-25 ENCOUNTER — Ambulatory Visit (INDEPENDENT_AMBULATORY_CARE_PROVIDER_SITE_OTHER): Payer: Managed Care, Other (non HMO) | Admitting: Family Medicine

## 2011-08-25 VITALS — BP 122/74 | HR 77 | Resp 18 | Ht 66.0 in | Wt 188.0 lb

## 2011-08-25 DIAGNOSIS — Z01419 Encounter for gynecological examination (general) (routine) without abnormal findings: Secondary | ICD-10-CM | POA: Insufficient documentation

## 2011-08-25 DIAGNOSIS — N760 Acute vaginitis: Secondary | ICD-10-CM

## 2011-08-25 DIAGNOSIS — D259 Leiomyoma of uterus, unspecified: Secondary | ICD-10-CM

## 2011-08-25 DIAGNOSIS — E663 Overweight: Secondary | ICD-10-CM

## 2011-08-25 DIAGNOSIS — Z113 Encounter for screening for infections with a predominantly sexual mode of transmission: Secondary | ICD-10-CM | POA: Insufficient documentation

## 2011-08-25 DIAGNOSIS — I10 Essential (primary) hypertension: Secondary | ICD-10-CM

## 2011-08-25 DIAGNOSIS — Z0000 Encounter for general adult medical examination without abnormal findings: Secondary | ICD-10-CM

## 2011-08-25 NOTE — Assessment & Plan Note (Signed)
Improved. Pt applauded on succesful weight loss through lifestyle change, and encouraged to continue same. Weight loss goal set for the next several months.  

## 2011-08-25 NOTE — Assessment & Plan Note (Signed)
Pelvic and breast exam done , also recta, entire documentation is in record. The importance of maintaining healthy lifestyle and goal of healthy weight is stressed

## 2011-08-25 NOTE — Patient Instructions (Addendum)
F/u in 6 month, call if you need me before.  Congrats on weight loss through lifestyle change, keep it up.  Increase walking up to 2 to 3 miles daily, continue diet which is 80% fruit or vegetable   No med changes.  Labs ordered from 7/02 today please.  You are referred for pelvic US f/u fibroids.  You will get information, on BV

## 2011-08-25 NOTE — Assessment & Plan Note (Signed)
Large womb, with h/o ovarian cyst, refer for imaging

## 2011-08-25 NOTE — Progress Notes (Signed)
  Subjective:    Patient ID: Erin Forbes, female    DOB: 1970-05-10, 41 y.o.   MRN: [id number]  HPI The PT is here for annual exam  and re-evaluation of chronic medical conditions, medication management and review of any available recent lab and radiology data. Unfortunately , labs are still outstanding Preventive health is updated, specifically  Cancer screening and Immunization.    The PT denies any adverse reactions to current medications since the last visit.  There are no new concerns. Has done extremely well with lifestyle change, excellent weight loss, and now normotensive, taking bP med as prescribed There are no specific complaints       Review of Systems See HPI Denies recent fever or chills. Denies sinus pressure, nasal congestion, ear pain or sore throat. Denies chest congestion, productive cough or wheezing. Denies chest pains, palpitations and leg swelling Denies abdominal pain, nausea, vomiting,diarrhea or constipation.   Denies dysuria, frequency, hesitancy or incontinence. Denies joint pain, swelling and limitation in mobility. Denies headaches, seizures, numbness, or tingling. Denies depression, anxiety or insomnia. Denies skin break down or rash.        Objective:   Physical Exam Pleasant well nourished female, alert and oriented x 3, in no cardio-pulmonary distress. Afebrile. HEENT No facial trauma or asymetry. Sinuses non tender.  EOMI, PERTL, fundoscopic exam is normal, no hemorhage or exudate.  External ears normal, tympanic membranes clear. Oropharynx moist, no exudate, good dentition. Neck: supple, no adenopathy,JVD or thyromegaly.No bruits.  Chest: Clear to ascultation bilaterally.No crackles or wheezes. Non tender to palpation  Breast: No asymetry,no masses. No nipple discharge or inversion. No axillary or supraclavicular adenopathy  Cardiovascular system; Heart sounds normal,  S1 and  S2 ,no S3.  No murmur, or thrill. Apical beat not  displaced Peripheral pulses normal.  Abdomen: Soft, non tender, no organomegaly or masses. No bruits. Bowel sounds normal. No guarding, tenderness or rebound.  Rectal:  No mass. Guaiac negative stool.  GU: External genitalia normal. No lesions. Vaginal canal normal Fishy  discharge. Uterus enlarged, no adnexal masses, no cervical motion or adnexal tenderness.  Musculoskeletal exam: Full ROM of spine, hips , shoulders and knees. No deformity ,swelling or crepitus noted. No muscle wasting or atrophy.   Neurologic: Cranial nerves 2 to 12 intact. Power, tone ,sensation and reflexes normal throughout. No disturbance in gait. No tremor.  Skin: Intact, no ulceration, erythema , scaling or rash noted. Pigmentation normal throughout  Psych; Normal mood and affect. Judgement and concentration normal        Assessment & Plan:

## 2011-08-25 NOTE — Assessment & Plan Note (Signed)
Controlled, no change in medication DASH diet and commitment to daily physical activity for a minimum of 30 minutes discussed and encouraged, as a part of hypertension management. The importance of attaining a healthy weight is also discussed.  

## 2011-08-27 ENCOUNTER — Other Ambulatory Visit: Payer: Self-pay | Admitting: Family Medicine

## 2011-08-27 ENCOUNTER — Other Ambulatory Visit: Payer: Self-pay

## 2011-08-27 MED ORDER — METRONIDAZOLE 500 MG PO TABS: 500.0000 mg | ORAL_TABLET | Freq: Two times a day (BID) | ORAL | Status: DC

## 2011-09-12 ENCOUNTER — Ambulatory Visit (HOSPITAL_COMMUNITY): Admission: RE | Admit: 2011-09-12 | Payer: Managed Care, Other (non HMO) | Source: Ambulatory Visit

## 2011-09-12 ENCOUNTER — Ambulatory Visit (HOSPITAL_COMMUNITY): Payer: Managed Care, Other (non HMO)

## 2012-02-25 ENCOUNTER — Ambulatory Visit (INDEPENDENT_AMBULATORY_CARE_PROVIDER_SITE_OTHER): Payer: Managed Care, Other (non HMO) | Admitting: Family Medicine

## 2012-02-25 ENCOUNTER — Encounter: Payer: Self-pay | Admitting: Family Medicine

## 2012-02-25 VITALS — BP 110/78 | HR 86 | Resp 16 | Ht 66.0 in | Wt 198.0 lb

## 2012-02-25 DIAGNOSIS — Z13220 Encounter for screening for lipoid disorders: Secondary | ICD-10-CM

## 2012-02-25 DIAGNOSIS — F411 Generalized anxiety disorder: Secondary | ICD-10-CM

## 2012-02-25 DIAGNOSIS — R5383 Other fatigue: Secondary | ICD-10-CM

## 2012-02-25 DIAGNOSIS — R51 Headache: Secondary | ICD-10-CM

## 2012-02-25 DIAGNOSIS — I10 Essential (primary) hypertension: Secondary | ICD-10-CM

## 2012-02-25 DIAGNOSIS — E663 Overweight: Secondary | ICD-10-CM

## 2012-02-25 DIAGNOSIS — F329 Major depressive disorder, single episode, unspecified: Secondary | ICD-10-CM

## 2012-02-25 DIAGNOSIS — Z139 Encounter for screening, unspecified: Secondary | ICD-10-CM

## 2012-02-25 LAB — CBC
HCT: 34.7 % — ABNORMAL LOW (ref 36.0–46.0)
MCH: 25.7 pg — ABNORMAL LOW (ref 26.0–34.0)
MCHC: 33.4 g/dL (ref 30.0–36.0)
MCV: 76.9 fL — ABNORMAL LOW (ref 78.0–100.0)
RDW: 15.7 % — ABNORMAL HIGH (ref 11.5–15.5)
WBC: 6.3 10*3/uL (ref 4.0–10.5)

## 2012-02-25 LAB — COMPREHENSIVE METABOLIC PANEL
AST: 15 U/L (ref 0–37)
BUN: 10 mg/dL (ref 6–23)
Calcium: 9.1 mg/dL (ref 8.4–10.5)
Chloride: 109 mEq/L (ref 96–112)
Creat: 0.77 mg/dL (ref 0.50–1.10)

## 2012-02-25 LAB — LIPID PANEL
HDL: 42 mg/dL (ref >39)
LDL Cholesterol: 56 mg/dL (ref 0–99)
Total CHOL/HDL Ratio: 2.6 Ratio
VLDL: 11 mg/dL (ref 0–40)

## 2012-02-25 MED ORDER — TRIAMTERENE-HCTZ 37.5-25 MG PO TABS: ORAL_TABLET | ORAL | Status: DC

## 2012-02-25 MED ORDER — PHENTERMINE HCL 37.5 MG PO TABS: 37.5000 mg | ORAL_TABLET | Freq: Every day | ORAL | Status: DC

## 2012-02-25 NOTE — Patient Instructions (Addendum)
F/u in 4 month  Fasting lipid, cmp, hBA1C, vit D , cbc and TSH today  It is important that you exercise regularly at least 60 minutes 5 times a week. If you develop chest pain, have severe difficulty breathing, or feel very tired, stop exercising immediately and seek medical attention   A healthy diet is rich in fruit, vegetables and whole grains. Poultry fish, nuts and beans are a healthy choice for protein rather then red meat. A low sodium diet and drinking 64 ounces of water daily is generally recommended. Oils and sweet should be limited. Carbohydrates especially for those who are diabetic or overweight, should be limited to 30-45 gram per meal. It is important to eat on a regular schedule, at least 3 times daily. Snacks should be primarily fruits, vegetables or nuts.  You will get a 1500 calorie diet sheet  Phentermine half daily is prescribed  Weight loss goal of 4 pounds per [employer]

## 2012-02-26 LAB — HEMOGLOBIN A1C: Hgb A1c MFr Bld: 5.8 % — ABNORMAL HIGH (ref <5.7)

## 2012-03-03 ENCOUNTER — Other Ambulatory Visit: Payer: Self-pay | Admitting: Family Medicine

## 2012-03-05 NOTE — Progress Notes (Signed)
  Subjective:    Patient ID: Erin Forbes, female    DOB: 1970/12/24, 42 y.o.   MRN: [id number]  HPI The PT is here for follow up and re-evaluation of chronic medical conditions, medication management and review of any available recent lab and radiology data.  Preventive health is updated, specifically  Cancer screening and Immunization.   Unfortunately pt lost her MOm since her last visit, but is dealing fairly well with this by her account. The PT denies any adverse reactions to current medications since the last visit.  Concerned about weight gain, despite exercise, wants help with phentermine    Review of Systems See HPI Denies recent fever or chills. Denies sinus pressure, nasal congestion, ear pain or sore throat. Denies chest congestion, productive cough or wheezing. Denies chest pains, palpitations and leg swelling Denies abdominal pain, nausea, vomiting,diarrhea or constipation.   Denies dysuria, frequency, hesitancy or incontinence. Denies joint pain, swelling and limitation in mobility. Denies headaches, seizures, numbness, or tingling. Denies depression, anxiety or insomnia. Denies skin break down or rash.        Objective:   Physical Exam  Patient alert and oriented and in no cardiopulmonary distress.  HEENT: No facial asymmetry, EOMI, no sinus tenderness,  oropharynx pink and moist.  Neck supple no adenopathy.  Chest: Clear to auscultation bilaterally.  CVS: S1, S2 no murmurs, no S3.  ABD: Soft non tender. Bowel sounds normal.  Ext: No edema  MS: Adequate ROM spine, shoulders, hips and knees.  Skin: Intact, no ulcerations or rash noted.  Psych: Good eye contact, normal affect. Memory intact not anxious or depressed appearing.  CNS: CN 2-12 intact, power, tone and sensation normal throughout.       Assessment & Plan:

## 2012-03-05 NOTE — Assessment & Plan Note (Signed)
improved

## 2012-03-05 NOTE — Assessment & Plan Note (Signed)
Improved , reduced frequency and severity

## 2012-03-05 NOTE — Assessment & Plan Note (Signed)
Controlled, no change in medication DASH diet and commitment to daily physical activity for a minimum of 30 minutes discussed and encouraged, as a part of hypertension management. The importance of attaining a healthy weight is also discussed.  

## 2012-03-05 NOTE — Assessment & Plan Note (Signed)
Deteriorated. Patient re-educated about  the importance of commitment to a  minimum of 150 minutes of exercise per week. The importance of healthy food choices with portion control discussed. Encouraged to start a food diary, count calories and to consider  joining a support group. Sample diet sheets offered. Goals set by the patient for the next several months.    

## 2012-03-15 ENCOUNTER — Other Ambulatory Visit: Payer: Self-pay

## 2012-03-15 MED ORDER — ERGOCALCIFEROL 1.25 MG (50000 UT) PO CAPS: 50000.0000 [IU] | ORAL_CAPSULE | ORAL | Status: DC

## 2012-03-19 ENCOUNTER — Other Ambulatory Visit: Payer: Self-pay

## 2012-03-19 MED ORDER — ERGOCALCIFEROL 1.25 MG (50000 UT) PO CAPS: 50000.0000 [IU] | ORAL_CAPSULE | ORAL | Status: DC

## 2012-06-23 ENCOUNTER — Ambulatory Visit (INDEPENDENT_AMBULATORY_CARE_PROVIDER_SITE_OTHER): Payer: Managed Care, Other (non HMO) | Admitting: Family Medicine

## 2012-06-23 ENCOUNTER — Encounter: Payer: Self-pay | Admitting: Family Medicine

## 2012-06-23 VITALS — BP 120/82 | HR 83 | Resp 16 | Ht 66.0 in | Wt 199.0 lb

## 2012-06-23 DIAGNOSIS — R5383 Other fatigue: Secondary | ICD-10-CM

## 2012-06-23 DIAGNOSIS — E669 Obesity, unspecified: Secondary | ICD-10-CM

## 2012-06-23 DIAGNOSIS — E559 Vitamin D deficiency, unspecified: Secondary | ICD-10-CM

## 2012-06-23 DIAGNOSIS — R7301 Impaired fasting glucose: Secondary | ICD-10-CM

## 2012-06-23 DIAGNOSIS — R51 Headache: Secondary | ICD-10-CM

## 2012-06-23 DIAGNOSIS — I10 Essential (primary) hypertension: Secondary | ICD-10-CM

## 2012-06-23 DIAGNOSIS — R5381 Other malaise: Secondary | ICD-10-CM

## 2012-06-23 MED ORDER — IBUPROFEN 800 MG PO TABS: 800.0000 mg | ORAL_TABLET | Freq: Three times a day (TID) | ORAL | Status: DC | PRN

## 2012-06-23 MED ORDER — TOPIRAMATE 25 MG PO TABS: 25.0000 mg | ORAL_TABLET | Freq: Every day | ORAL | Status: DC

## 2012-06-23 MED ORDER — PHENTERMINE HCL 37.5 MG PO TABS: 37.5000 mg | ORAL_TABLET | Freq: Every day | ORAL | Status: DC

## 2012-06-23 NOTE — Patient Instructions (Addendum)
F/u in 4 month  New bedtime is 10pm, then up at 6 for 30 minute exercise in the morning!  New for headache is topamax 1 at bedtime, this will reduce frequency of headaches, if you feel a headache starting oake ibuprofen 800mg  1 tablet, maximum of two ibuprofen in 1 day, maximum twice weekly.  Please take weekly vit D for the next 4 months, we will recheck your level  Continue phentermine as before, half daily  Organize and structure your eating as we discussed  HBA1C , fasting chem 7 and vit D in 4 months, just before next visit

## 2012-06-23 NOTE — Progress Notes (Signed)
  Subjective:    Patient ID: Erin Forbes, female    DOB: [birth year], 42 y.o.   MRN: [id number]  HPI 1 month h/o pounding temporal headaches on avg 3 times per week, have been disabling, associaited with nausea and photophobia, no residual localized weakness or numbness, relieved by sleep. She can identify no food or new stress triggers, has ahd these in the past  No weight loss success yet, exercise is sporadic and diet not controlled though there is improvement in food choice   Review of Systems See HPI Denies recent fever or chills. Denies sinus pressure, nasal congestion, ear pain or sore throat. Denies chest congestion, productive cough or wheezing. Denies chest pains, palpitations and leg swelling Denies abdominal pain, nausea, vomiting,diarrhea or constipation.   Denies dysuria, frequency, hesitancy or incontinence. Denies joint pain, swelling and limitation in mobility. Denies , seizures, numbness, or tingling. Denies depression, anxiety or insomnia. Denies skin break down or rash.        Objective:   Physical Exam  Patient alert and oriented and in no cardiopulmonary distress.  HEENT: No facial asymmetry, EOMI, no sinus tenderness,  oropharynx pink and moist.  Neck supple no adenopathy.Fundoscopy normal exam, no hemorrhage or exudate, disc margins clear  Chest: Clear to auscultation bilaterally.  CVS: S1, S2 no murmurs, no S3.  ABD: Soft non tender. Bowel sounds normal.  Ext: No edema  MS: Adequate ROM spine, shoulders, hips and knees.  Skin: Intact, no ulcerations or rash noted.  Psych: Good eye contact, normal affect. Memory intact not anxious or depressed appearing.  CNS: CN 2-12 intact, power, tone and sensation normal throughout.       Assessment & Plan:  2 week

## 2012-06-27 DIAGNOSIS — E559 Vitamin D deficiency, unspecified: Secondary | ICD-10-CM | POA: Insufficient documentation

## 2012-06-27 NOTE — Assessment & Plan Note (Signed)
Controlled, no change in medication DASH diet and commitment to daily physical activity for a minimum of 30 minutes discussed and encouraged, as a part of hypertension management. The importance of attaining a healthy weight is also discussed.  

## 2012-06-27 NOTE — Assessment & Plan Note (Signed)
Deteriorated. Patient re-educated about  the importance of commitment to a  minimum of 150 minutes of exercise per week. The importance of healthy food choices with portion control discussed. Encouraged to start a food diary, count calories and to consider  joining a support group. Sample diet sheets offered. Goals set by the patient for the next several months.    

## 2012-06-27 NOTE — Assessment & Plan Note (Signed)
New onset migraine headaches , which she has had in the past Start headache diary. Start prophylactic topamax, and use ibuprofen for headache Call if worsens

## 2012-06-27 NOTE — Assessment & Plan Note (Signed)
Importance of complying with treatment stressed

## 2012-10-27 ENCOUNTER — Encounter: Payer: Self-pay | Admitting: Family Medicine

## 2012-10-27 ENCOUNTER — Ambulatory Visit (INDEPENDENT_AMBULATORY_CARE_PROVIDER_SITE_OTHER): Payer: Managed Care, Other (non HMO) | Admitting: Family Medicine

## 2012-10-27 VITALS — BP 118/82 | HR 76 | Resp 16 | Ht 66.0 in | Wt 194.4 lb

## 2012-10-27 DIAGNOSIS — E669 Obesity, unspecified: Secondary | ICD-10-CM

## 2012-10-27 DIAGNOSIS — E559 Vitamin D deficiency, unspecified: Secondary | ICD-10-CM

## 2012-10-27 DIAGNOSIS — Z1239 Encounter for other screening for malignant neoplasm of breast: Secondary | ICD-10-CM

## 2012-10-27 DIAGNOSIS — R7309 Other abnormal glucose: Secondary | ICD-10-CM

## 2012-10-27 DIAGNOSIS — I10 Essential (primary) hypertension: Secondary | ICD-10-CM

## 2012-10-27 DIAGNOSIS — R7303 Prediabetes: Secondary | ICD-10-CM

## 2012-10-27 DIAGNOSIS — R5381 Other malaise: Secondary | ICD-10-CM

## 2012-10-27 DIAGNOSIS — Z79899 Other long term (current) drug therapy: Secondary | ICD-10-CM

## 2012-10-27 MED ORDER — PHENTERMINE HCL 37.5 MG PO TABS: 37.5000 mg | ORAL_TABLET | Freq: Every day | ORAL | Status: DC

## 2012-10-27 NOTE — Patient Instructions (Addendum)
F/u in 3 month, 3 weeks, with rectal call if you need me before.  Congrats on 4 pound weight loss.  Please commit to eating regularly on schedule, snacks need to be restricted  To fresh fruit and veg, and commit to 30 mins exercise every day   Start phentermine one daily.  Glad that sleep is better with better sleep hygiene  Weight loss goal of 2.5 to 3 pounds each month  CBC, fasting lipid, TSH and ViD D in Februaury , before follow up visit  Stop at front desk for mammogram appt pls

## 2012-10-29 ENCOUNTER — Telehealth: Payer: Self-pay | Admitting: Family Medicine

## 2012-10-29 NOTE — Telephone Encounter (Signed)
Patient is aware 

## 2012-10-30 DIAGNOSIS — R7303 Prediabetes: Secondary | ICD-10-CM | POA: Insufficient documentation

## 2012-10-30 NOTE — Progress Notes (Signed)
  Subjective:    Patient ID: Erin Forbes, female    DOB: Sep 21, 1970, 42 y.o.   MRN: [id number]  HPI The PT is here for follow up and re-evaluation of chronic medical conditions, medication management and review of any available recent lab and radiology data.  Preventive health is updated, specifically  Cancer screening and Immunization.  Refusing flu vaccine Questions or concerns regarding consultations or procedures which the PT has had in the interim are  addressed. The PT denies any adverse reactions to current medications since the last visit.  There are no new concerns. Wants to start phentermine to help with weight loss There are no specific complaints       Review of Systems See HPI Denies recent fever or chills. Denies sinus pressure, nasal congestion, ear pain or sore throat. Denies chest congestion, productive cough or wheezing. Denies chest pains, palpitations and leg swelling Denies abdominal pain, nausea, vomiting,diarrhea or constipation.   Denies dysuria, frequency, hesitancy or incontinence. Denies joint pain, swelling and limitation in mobility. Denies headaches, seizures, numbness, or tingling. Denies depression, anxiety or insomnia. Denies skin break down or rash.        Objective:   Physical Exam Patient alert and oriented and in no cardiopulmonary distress.  HEENT: No facial asymmetry, EOMI, no sinus tenderness,  oropharynx pink and moist.  Neck supple no adenopathy.  Chest: Clear to auscultation bilaterally.  CVS: S1, S2 no murmurs, no S3.  ABD: Soft non tender. Bowel sounds normal.  Ext: No edema  MS: Adequate ROM spine, shoulders, hips and knees.  Skin: Intact, no ulcerations or rash noted.  Psych: Good eye contact, normal affect. Memory intact not anxious or depressed appearing.  CNS: CN 2-12 intact, power, tone and sensation normal throughout.        Assessment & Plan:

## 2012-10-30 NOTE — Assessment & Plan Note (Signed)
Controlled, no change in medication DASH diet and commitment to daily physical activity for a minimum of 30 minutes discussed and encouraged, as a part of hypertension management. The importance of attaining a healthy weight is also discussed.  

## 2012-10-30 NOTE — Assessment & Plan Note (Signed)
Patient educated about the importance of limiting  Carbohydrate intake , the need to commit to daily physical activity for a minimum of 30 minutes , and to commit weight loss. The fact that changes in all these areas will reduce or eliminate all together the development of diabetes is stressed.   Updated HBA1C next year

## 2012-10-30 NOTE — Assessment & Plan Note (Signed)
Weekly supplement for 6 months total

## 2012-10-30 NOTE — Assessment & Plan Note (Signed)
Improved. Pt applauded on succesful weight loss through lifestyle change, and encouraged to continue same. Weight loss goal set for the next several months. Pt to start phentermine

## 2012-11-16 ENCOUNTER — Ambulatory Visit (HOSPITAL_COMMUNITY): Payer: Managed Care, Other (non HMO)

## 2013-02-24 ENCOUNTER — Encounter: Payer: Self-pay | Admitting: Family Medicine

## 2013-02-24 ENCOUNTER — Ambulatory Visit (INDEPENDENT_AMBULATORY_CARE_PROVIDER_SITE_OTHER): Payer: Managed Care, Other (non HMO) | Admitting: Family Medicine

## 2013-02-24 VITALS — BP 120/80 | HR 85 | Resp 16 | Ht 66.0 in | Wt 194.0 lb

## 2013-02-24 DIAGNOSIS — E669 Obesity, unspecified: Secondary | ICD-10-CM

## 2013-02-24 DIAGNOSIS — N83201 Unspecified ovarian cyst, right side: Secondary | ICD-10-CM

## 2013-02-24 DIAGNOSIS — G4700 Insomnia, unspecified: Secondary | ICD-10-CM

## 2013-02-24 DIAGNOSIS — D649 Anemia, unspecified: Secondary | ICD-10-CM

## 2013-02-24 DIAGNOSIS — I10 Essential (primary) hypertension: Secondary | ICD-10-CM

## 2013-02-24 DIAGNOSIS — D259 Leiomyoma of uterus, unspecified: Secondary | ICD-10-CM

## 2013-02-24 DIAGNOSIS — E559 Vitamin D deficiency, unspecified: Secondary | ICD-10-CM

## 2013-02-24 DIAGNOSIS — R5383 Other fatigue: Secondary | ICD-10-CM

## 2013-02-24 DIAGNOSIS — R7309 Other abnormal glucose: Secondary | ICD-10-CM

## 2013-02-24 DIAGNOSIS — N83209 Unspecified ovarian cyst, unspecified side: Secondary | ICD-10-CM

## 2013-02-24 DIAGNOSIS — Z139 Encounter for screening, unspecified: Secondary | ICD-10-CM

## 2013-02-24 DIAGNOSIS — R5381 Other malaise: Secondary | ICD-10-CM

## 2013-02-24 DIAGNOSIS — G43109 Migraine with aura, not intractable, without status migrainosus: Secondary | ICD-10-CM

## 2013-02-24 DIAGNOSIS — R7303 Prediabetes: Secondary | ICD-10-CM

## 2013-02-24 LAB — CBC WITH DIFFERENTIAL/PLATELET
Basophils Absolute: 0 10*3/uL (ref 0.0–0.1)
Basophils Relative: 0 % (ref 0–1)
EOS ABS: 0.1 10*3/uL (ref 0.0–0.7)
EOS PCT: 1 % (ref 0–5)
HEMATOCRIT: 33.8 % — AB (ref 36.0–46.0)
Hemoglobin: 11.4 g/dL — ABNORMAL LOW (ref 12.0–15.0)
LYMPHS ABS: 2 10*3/uL (ref 0.7–4.0)
LYMPHS PCT: 30 % (ref 12–46)
MCH: 25.9 pg — AB (ref 26.0–34.0)
MCHC: 33.7 g/dL (ref 30.0–36.0)
MCV: 76.8 fL — ABNORMAL LOW (ref 78.0–100.0)
MONO ABS: 0.4 10*3/uL (ref 0.1–1.0)
Monocytes Relative: 6 % (ref 3–12)
Neutro Abs: 4.2 10*3/uL (ref 1.7–7.7)
Neutrophils Relative %: 63 % (ref 43–77)
PLATELETS: 338 10*3/uL (ref 150–400)
RBC: 4.4 MIL/uL (ref 3.87–5.11)
RDW: 16 % — AB (ref 11.5–15.5)
WBC: 6.7 10*3/uL (ref 4.0–10.5)

## 2013-02-24 LAB — LIPID PANEL
Cholesterol: 103 mg/dL (ref 0–200)
HDL: 42 mg/dL (ref >39)
LDL CALC: 48 mg/dL (ref 0–99)
TRIGLYCERIDES: 67 mg/dL (ref <150)
Total CHOL/HDL Ratio: 2.5 Ratio
VLDL: 13 mg/dL (ref 0–40)

## 2013-02-24 LAB — HEMOGLOBIN A1C
Hgb A1c MFr Bld: 5.9 % — ABNORMAL HIGH (ref <5.7)
Mean Plasma Glucose: 123 mg/dL — ABNORMAL HIGH (ref <117)

## 2013-02-24 LAB — BASIC METABOLIC PANEL
BUN: 10 mg/dL (ref 6–23)
CO2: 27 meq/L (ref 19–32)
CREATININE: 0.72 mg/dL (ref 0.50–1.10)
Calcium: 9.1 mg/dL (ref 8.4–10.5)
Chloride: 108 mEq/L (ref 96–112)
Glucose, Bld: 89 mg/dL (ref 70–99)
POTASSIUM: 4.3 meq/L (ref 3.5–5.3)
Sodium: 142 mEq/L (ref 135–145)

## 2013-02-24 LAB — FERRITIN: Ferritin: 23 ng/mL (ref 10–291)

## 2013-02-24 LAB — IRON: Iron: 30 ug/dL — ABNORMAL LOW (ref 42–145)

## 2013-02-24 LAB — TSH: TSH: 4.329 u[IU]/mL (ref 0.350–4.500)

## 2013-02-24 MED ORDER — TEMAZEPAM 7.5 MG PO CAPS: 7.5000 mg | ORAL_CAPSULE | Freq: Every evening | ORAL | Status: DC | PRN

## 2013-02-24 MED ORDER — PHENTERMINE HCL 37.5 MG PO TABS: 37.5000 mg | ORAL_TABLET | Freq: Every day | ORAL | Status: DC

## 2013-02-24 NOTE — Patient Instructions (Addendum)
F/u in 3.5 month, with rectal , call if you need me before  It is important that you exercise regularly at least 30 minutes 5 times a week. If you develop chest pain, have severe difficulty breathing, or feel very tired, stop exercising immediately and seek medical attention   A healthy diet is rich in fruit, vegetables and whole grains. Poultry fish, nuts and beans are a healthy choice for protein rather then red meat. A low sodium diet and drinking 64 ounces of water daily is generally recommended. Oils and sweet should be limited. Carbohydrates especially for those who are diabetic or overweight, should be limited to 60-45 gram per meal. It is important to eat on a regular schedule, at least 3 times daily. Snacks should be primarily fruits, vegetables or nuts.  Labs today, CBc, iron and ferritin, lipids, TSH , HBA1C, vit D, chem 7  Weight losas goal of 3 pounds per month  Metabolic Syndrome, Adult Metabolic syndrome descibes a group of risk factors for heart disease and diabetes. This syndrome has other names including Insulin Resistance Syndrome. The more risk factors you have, the higher your risk of having a heart attack, stroke, or developing diabetes. These risk factors include:  High blood sugar.  High blood triglyceride (a fat found in the blood) level.  High blood pressure.  Abdominal obesity (your extra weight is around your waist instead of your hips).  Low levels of high-density lipoprotein, HDL (good blood cholesterol). If you have any three of these risk factors, you have metabolic syndrome. If you have even one of these factors, you should make lifestyle changes to improve your health in order to prevent serious health diseases.  In people with metabolic syndrome, the cells do not respond properly to insulin. This can lead to high levels of glucose in the blood, which can interfere with normal body processes. Eventually, this can cause high blood pressure and higher fat levels  in the blood, and inflammation of your blood vessels. The result can be heart disease and stroke.  CAUSES   Eating a diet rich in calories and saturated fat.  Too little physical activity.  Being overweight. Other underlying causes are:  Family history (genetics).  Ethnicity (South Asians are at a higher risk).  Older age (your chances of developing metabolic syndrome are higher as you grow older).  Insulin resistance. SYMPTOMS  By itself, metabolic syndrome has no symptoms. However, you might have symptoms of diabetes (high blood sugar) or high blood pressure, such as:  Increased thirst, urination, and tiredness.  Dizzy spells.  Dull headaches that are unusual for you.  Blurred vision.  Nosebleeds. DIAGNOSIS  Your caregiver may make a diagnosis of metabolic syndrome if you have at least three of these factors:  If you are overweight mostly around the waist. This means a waistline greater than 40" in men and more than 35" in women. The waistline limits are 31 to 35 inches for women and 37 to 39 inches for men. In those who have certain genetic risk factors, such as having a family history of diabetes or being of Asian descent.  If you have a blood pressure of 130/85 mm Hg or more, or if you are being treated for high blood pressure.  If your blood triglyceride level is 150 mg/dL or more, or you are being treated for high levels of triglyceride.  If the level of HDL in your blood is below 40 mg/dL in men, less than 50 mg/dL in women,  or you are receiving treatment for low levels of HDL.  If the level of sugar in your blood is high with fasting blood sugar level of 110 mg/dL or more, or you are under treatment for diabetes. TREATMENT  Your caregiver may have you make lifestyle changes, which may include:  Exercise.  Losing weight.  Maintaining a healthy diet.  Quitting smoking. The lifestyle changes listed above are key in reducing your risk for heart disease and  stroke. Medicines may also be prescribed to help your body respond to insulin better and to reduce your blood pressure and blood fat levels. Aspirin may be recommended to reduce risks of heart disease or stroke.  HOME CARE INSTRUCTIONS   Exercise.  Measure your waist at regular intervals just above the hipbones after you have breathed out.  Maintain a healthy diet.  Eat fruits, such as apples, oranges, and pears.  Eat vegetables.  Eat legumes, such as kidney beans, peas, and lentils.  Eat food rich in soluble fiber, such as whole grain cereal, oatmeal, and oat bran.  Use olive or safflower oils and avoid saturated fats.  Eat nuts.  Limit the amount of salt you eat or add to food.  Limit the amount of alcohol you drink.  Include fish in your diet, if possible.  Stop smoking if you are a smoker.  Maintain regular follow-up appointments.  Follow your caregiver's advice. SEEK MEDICAL CARE IF:   You feel very tired or fatigued.  You develop excessive thirst.  You pass large quantities of urine.  You are putting on weight around your waist rather than losing weight.  You develop headaches over and over again.  You have off-and-on dizzy spells. SEEK IMMEDIATE MEDICAL CARE IF:   You develop nosebleeds.  You develop sudden blurred vision.  You develop sudden dizzy spells.  You develop chest pains, trouble breathing, or feel an abnormal or irregular heart beat.  You have a fainting episode.  You develop any sudden trouble speaking and/or swallowing.  You develop sudden weakness in one arm and/or one leg. MAKE SURE YOU:   Understand these instructions.  Will watch your condition.  Will get help right away if you are not doing well or get worse. Document Released: 04/15/2007 Document Revised: 03/31/2011 Document Reviewed: 04/15/2007 [facility] Patient Information 2014 [place], [place].

## 2013-02-25 ENCOUNTER — Encounter: Payer: Self-pay | Admitting: Family Medicine

## 2013-02-25 DIAGNOSIS — N83201 Unspecified ovarian cyst, right side: Secondary | ICD-10-CM | POA: Insufficient documentation

## 2013-02-25 LAB — VITAMIN D 25 HYDROXY (VIT D DEFICIENCY, FRACTURES): Vit D, 25-Hydroxy: 37 ng/mL (ref 30–89)

## 2013-02-25 NOTE — Assessment & Plan Note (Signed)
Improved and corrected, Ok to continue weekly vit d for several more months

## 2013-02-25 NOTE — Assessment & Plan Note (Signed)
Heavy menses, but no interest in intervention

## 2013-02-25 NOTE — Assessment & Plan Note (Signed)
Pt to start daily iron supplement

## 2013-02-25 NOTE — Assessment & Plan Note (Signed)
Works a 12 hr day and gets home to care for and cook for her children. Difficulty unwinding and falling asleep Sleep hygiene reviewed, she is to start a low dose of restoril also

## 2013-02-25 NOTE — Assessment & Plan Note (Signed)
Controlled, no change in medication DASH diet and commitment to daily physical activity for a minimum of 30 minutes discussed and encouraged, as a part of hypertension management. The importance of attaining a healthy weight is also discussed.  

## 2013-02-25 NOTE — Assessment & Plan Note (Signed)
Improved as gfar as frequency and severity are concerned. Ibuprofen manages acute episode

## 2013-02-25 NOTE — Assessment & Plan Note (Signed)
Improved.Needs to be more consistent however.Currently taking phentermine, goal of 2 to 3 pound weight loss per  month Pt applauded on succesful weight loss through lifestyle change, and encouraged to continue same. Weight loss goal set for the next several months.

## 2013-02-25 NOTE — Progress Notes (Signed)
Subjective:    Patient ID: Erin Forbes, female    DOB: May 26, 1970, 43 y.o.   MRN: [id number]  HPI The PT is here for follow up and re-evaluation of chronic medical conditions, medication management and review of any available recent lab and radiology data.  Preventive health is updated, specifically  Cancer screening and Immunization. Refuses flu vaccine   The PT denies any adverse reactions to current medications since the last visit.  C/o poor sleep, too "highly strung" by the end of each work day. Requests medication to help with sleep. 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 Denies sinus pressure, nasal congestion, ear pain or sore throat. Denies chest congestion, productive cough or wheezing. Denies chest pains, palpitations and leg swelling Denies abdominal pain, nausea, vomiting,diarrhea or constipation.   Denies dysuria, frequency, hesitancy or incontinence. Denies joint pain, swelling and limitation in mobility. Denies headaches, seizures, numbness, or tingling. Denies depression or  anxiety. Denies skin break down or rash.        Objective:   Physical Exam BP 120/80  Pulse 85  Resp 16  Ht 5\' 6"  (1.676 m)  Wt 194 lb (87.998 kg)  BMI 31.33 kg/m2  SpO2 99% Patient alert and oriented and in no cardiopulmonary distress.  HEENT: No facial asymmetry, EOMI, no sinus tenderness,  oropharynx pink and moist.  Neck supple no adenopathy.  Chest: Clear to auscultation bilaterally.  CVS: S1, S2 no murmurs, no S3.  ABD: Soft non tender. Bowel sounds normal.  Ext: No edema  MS:  Adequate ROM spine, shoulders, hips and knees.  Skin: Intact, no ulcerations or rash noted.  Psych: Good eye contact, normal affect. Memory intact not anxious or depressed appearing.  CNS: CN 2-12 intact, power, tone and sensation normal throughout.        Assessment & Plan:  HTN (hypertension) Controlled, no change in medication DASH diet and commitment to daily physical activity for a minimum of 30 minutes discussed and encouraged, as a part of hypertension management. The importance of attaining a healthy weight is also discussed.   Migraine headache with aura Improved as gfar as frequency and severity are concerned. Ibuprofen manages acute episode  Prediabetes Deteriorated Patient educated about the importance of limiting  Carbohydrate intake , the need to commit to daily physical activity for a minimum of 30 minutes , and to commit weight loss. The fact that changes in all these areas will reduce or eliminate all together the development of diabetes is stressed.     LEIOMYOMA, UTERUS Heavy menses, but no interest in intervention  Unspecified vitamin D deficiency Improved and corrected, Ok to continue weekly vit d for several more months  Obese Improved.Needs to be more consistent however.Currently taking phentermine, goal of 2 to 3 pound weight loss per  month Pt applauded on succesful weight loss through lifestyle change, and encouraged to continue same. Weight loss goal set for the next several months.   Anemia, iron deficiency Pt to start daily iron supplement  Insomnia Works a 12 hr day and gets home to care for and cook for  her children. Difficulty unwinding and falling asleep Sleep hygiene reviewed, she is to start a low dose of restoril also

## 2013-02-25 NOTE — Assessment & Plan Note (Signed)
Deteriorated Patient educated about the importance of limiting  Carbohydrate intake , the need to commit to daily physical activity for a minimum of 30 minutes , and to commit weight loss. The fact that changes in all these areas will reduce or eliminate all together the development of diabetes is stressed.    

## 2013-03-01 ENCOUNTER — Ambulatory Visit (HOSPITAL_COMMUNITY)
Admission: RE | Admit: 2013-03-01 | Discharge: 2013-03-01 | Disposition: A | Discharge status: Home / Self Care | Payer: Managed Care, Other (non HMO) | Source: Ambulatory Visit | Attending: Family Medicine | Admitting: Family Medicine

## 2013-03-01 ENCOUNTER — Other Ambulatory Visit (HOSPITAL_COMMUNITY): Payer: Managed Care, Other (non HMO)

## 2013-03-01 ENCOUNTER — Encounter: Payer: Self-pay | Admitting: Family Medicine

## 2013-03-01 ENCOUNTER — Ambulatory Visit (HOSPITAL_COMMUNITY): Admission: RE | Admit: 2013-03-01 | Payer: Managed Care, Other (non HMO) | Source: Ambulatory Visit

## 2013-03-01 DIAGNOSIS — N83201 Unspecified ovarian cyst, right side: Secondary | ICD-10-CM

## 2013-03-01 DIAGNOSIS — N838 Other noninflammatory disorders of ovary, fallopian tube and broad ligament: Secondary | ICD-10-CM | POA: Insufficient documentation

## 2013-03-01 DIAGNOSIS — Z09 Encounter for follow-up examination after completed treatment for conditions other than malignant neoplasm: Secondary | ICD-10-CM | POA: Insufficient documentation

## 2013-03-12 ENCOUNTER — Other Ambulatory Visit: Payer: Self-pay | Admitting: Family Medicine

## 2013-06-02 ENCOUNTER — Other Ambulatory Visit: Payer: Self-pay

## 2013-06-02 DIAGNOSIS — E669 Obesity, unspecified: Secondary | ICD-10-CM

## 2013-06-02 MED ORDER — PHENTERMINE HCL 37.5 MG PO TABS: 37.5000 mg | ORAL_TABLET | Freq: Every day | ORAL | Status: DC

## 2013-06-27 ENCOUNTER — Encounter: Payer: Self-pay | Admitting: Family Medicine

## 2013-06-27 ENCOUNTER — Ambulatory Visit (INDEPENDENT_AMBULATORY_CARE_PROVIDER_SITE_OTHER): Payer: Managed Care, Other (non HMO) | Admitting: Family Medicine

## 2013-06-27 VITALS — BP 118/80 | HR 84 | Resp 16 | Wt 192.0 lb

## 2013-06-27 DIAGNOSIS — Z1211 Encounter for screening for malignant neoplasm of colon: Secondary | ICD-10-CM

## 2013-06-27 DIAGNOSIS — E669 Obesity, unspecified: Secondary | ICD-10-CM

## 2013-06-27 DIAGNOSIS — E559 Vitamin D deficiency, unspecified: Secondary | ICD-10-CM

## 2013-06-27 DIAGNOSIS — R7309 Other abnormal glucose: Secondary | ICD-10-CM

## 2013-06-27 DIAGNOSIS — R7303 Prediabetes: Secondary | ICD-10-CM

## 2013-06-27 DIAGNOSIS — I10 Essential (primary) hypertension: Secondary | ICD-10-CM

## 2013-06-27 DIAGNOSIS — G43011 Migraine without aura, intractable, with status migrainosus: Secondary | ICD-10-CM

## 2013-06-27 LAB — POC HEMOCCULT BLD/STL (OFFICE/1-CARD/DIAGNOSTIC): Fecal Occult Blood, POC: NEGATIVE

## 2013-06-27 MED ORDER — IMIPRAMINE HCL 10 MG PO TABS: 10.0000 mg | ORAL_TABLET | Freq: Every day | ORAL | Status: DC

## 2013-06-27 NOTE — Patient Instructions (Signed)
F/u in 3.5 month, call if you need me before  Stop bP med and phentermine.  New for headache prevention is imipramine one at bedtime  You will be referred to headache specialist also for a brain scan to check for aneurysm and cause of headache  Please work on weight loss as blood sugar avg is elevated  HBa1C and vit D  3 to 5 days before next visit  Rectal ecam today is normal , no hidden blood in stool

## 2013-06-27 NOTE — Assessment & Plan Note (Signed)
Daily disabling headache for entire month of May.mmother diagnosed with intracranial aneurysm at 61 which caused her death. Needs imaging and management by headache specialist  Trial of imipramaine for prophylaxis in the interim

## 2013-06-27 NOTE — Progress Notes (Signed)
   Subjective:    Patient ID: Erin Forbes, female    DOB: [birth year], 43 y.o.   MRN: [id number]  HPI  The PT is here for follow up and re-evaluation of chronic medical conditions, medication management and review of any available recent lab and radiology data.  Preventive health is updated, specifically  Cancer screening and Immunization.   For may, pt notes increased frequency and severity in her headaches, had daily headaches rated at an 8 to 10, sleep provided temporary relief, ibuprofen 800mg  would enable her to sleep Experinces nausea and photophobia, experienced as a  pressure sometimes the entire head , other times the left side from back to front. Stopped both her blood pressure med and the phentermine which she had not been taking regularly to see if this would help, no headache in 3 days     Review of Systems See HPI Denies recent fever or chills. Denies sinus pressure, nasal congestion, ear pain or sore throat. Denies chest congestion, productive cough or wheezing. Denies chest pains, palpitations and leg swelling Denies abdominal pain, nausea, vomiting,diarrhea or constipation.   Denies dysuria, frequency, hesitancy or incontinence. Denies joint pain, swelling and limitation in mobility. . Denies depression, anxiety or insomnia. Denies skin break down or rash.        Objective:   Physical Exam BP 118/80  Pulse 84  Resp 16  Wt 192 lb (87.091 kg)  SpO2 100% Patient alert and oriented and in no cardiopulmonary distress.  HEENT: No facial asymmetry, EOMI,   oropharynx pink and moist.  Neck supple no JVD, no mass. Fundoscopy: no hemorhage or  Exudate noted Chest: Clear to auscultation bilaterally.  CVS: S1, S2 no murmurs, no S3.Regular rate.  ABD: Soft non tender. No organomegaly or mass. Normal BS Rectal: normal sphincter tone, no palpable mass, heme negative stool  Ext: No edema  MS: Adequate ROM spine, shoulders, hips and knees.  Skin: Intact, no  ulcerations or rash noted.  Psych: Good eye contact, normal affect. Memory intact not anxious or depressed appearing.  CNS: CN 2-12 intact, power,  normal throughout.no focal deficits noted.        Assessment & Plan:  Migraine headache without aura Daily disabling headache for entire month of May.mmother diagnosed with intracranial aneurysm at 61 which caused her death. Needs imaging and management by headache specialist  Trial of imipramaine for prophylaxis in the interim  HTN (hypertension) Pt to discontinue anti hypertensive and phentermine DASH diet and commitment to daily physical activity for a minimum of 30 minutes discussed and encouraged, as a part of hypertension management. The importance of attaining a healthy weight is also discussed.   Prediabetes Deteriorated. Patient educated about the importance of limiting  Carbohydrate intake , the need to commit to daily physical activity for a minimum of 30 minutes , and to commit weight loss. The fact that changes in all these areas will reduce or eliminate all together the development of diabetes is stressed.   Updated lab needed at/ before next visit.   Obese Improved. Pt applauded on succesful weight loss through lifestyle change, and encouraged to continue same. Weight loss goal set for the next several months.   Unspecified vitamin D deficiency Weekly vit D for 6 months  Colon cancer screening Normal rectal exam with heme negative stool

## 2013-06-30 ENCOUNTER — Ambulatory Visit (HOSPITAL_COMMUNITY): Payer: Managed Care, Other (non HMO)

## 2013-07-05 ENCOUNTER — Ambulatory Visit (HOSPITAL_COMMUNITY): Payer: Managed Care, Other (non HMO) | Attending: Family Medicine

## 2013-08-25 ENCOUNTER — Telehealth: Payer: Self-pay | Admitting: *Deleted

## 2013-08-25 MED ORDER — FUROSEMIDE 20 MG PO TABS: ORAL_TABLET | ORAL | Status: DC

## 2013-08-25 MED ORDER — POTASSIUM CHLORIDE CRYS ER 20 MEQ PO TBCR: EXTENDED_RELEASE_TABLET | ORAL | Status: DC

## 2013-08-25 NOTE — Telephone Encounter (Signed)
Pt called stating for the past week her feet has been swelling really bad, pt would like to know if there is something she can take or do to help the swelling go away. Please advise [phone]

## 2013-08-25 NOTE — Telephone Encounter (Signed)
Please advise 

## 2013-08-25 NOTE — Telephone Encounter (Signed)
Lasix and potassium for , as needed, use are sent in. I tried to call her , did not feel comfortable leaving a message on machine recording that I heard

## 2013-08-26 NOTE — Telephone Encounter (Signed)
Patient aware.

## 2013-08-27 ENCOUNTER — Other Ambulatory Visit: Payer: Self-pay | Admitting: Family Medicine

## 2013-08-29 DIAGNOSIS — Z1211 Encounter for screening for malignant neoplasm of colon: Secondary | ICD-10-CM | POA: Insufficient documentation

## 2013-08-29 NOTE — Assessment & Plan Note (Signed)
Improved. Pt applauded on succesful weight loss through lifestyle change, and encouraged to continue same. Weight loss goal set for the next several months.  

## 2013-08-29 NOTE — Assessment & Plan Note (Signed)
Normal rectal exam with heme negative stool

## 2013-08-29 NOTE — Assessment & Plan Note (Signed)
Weekly vit D for 6 months

## 2013-08-29 NOTE — Assessment & Plan Note (Signed)
Pt to discontinue anti hypertensive and phentermine DASH diet and commitment to daily physical activity for a minimum of 30 minutes discussed and encouraged, as a part of hypertension management. The importance of attaining a healthy weight is also discussed.

## 2013-08-29 NOTE — Assessment & Plan Note (Signed)
Deteriorated Patient educated about the importance of limiting  Carbohydrate intake , the need to commit to daily physical activity for a minimum of 30 minutes , and to commit weight loss. The fact that changes in all these areas will reduce or eliminate all together the development of diabetes is stressed.   \Updated lab needed at/ before next visit.  

## 2013-10-20 ENCOUNTER — Ambulatory Visit: Payer: Managed Care, Other (non HMO) | Admitting: Family Medicine

## 2013-11-04 ENCOUNTER — Other Ambulatory Visit: Payer: Self-pay

## 2013-12-06 ENCOUNTER — Encounter: Payer: Self-pay | Admitting: *Deleted

## 2013-12-06 ENCOUNTER — Ambulatory Visit: Payer: Managed Care, Other (non HMO) | Admitting: Family Medicine

## 2013-12-22 ENCOUNTER — Telehealth: Payer: Self-pay | Admitting: *Deleted

## 2013-12-22 NOTE — Telephone Encounter (Signed)
Pt called requesting a appt I made pt aware that our next available is 02/01/14 pt states she wants to make sure she does not have pnue. Pt advise

## 2013-12-26 NOTE — Telephone Encounter (Signed)
Called patient back and she feels better and denied to come in for visit

## 2014-01-04 ENCOUNTER — Encounter: Payer: Self-pay | Admitting: Family Medicine

## 2014-01-04 ENCOUNTER — Ambulatory Visit (INDEPENDENT_AMBULATORY_CARE_PROVIDER_SITE_OTHER): Payer: Managed Care, Other (non HMO) | Admitting: Family Medicine

## 2014-01-04 ENCOUNTER — Other Ambulatory Visit: Payer: Self-pay | Admitting: Family Medicine

## 2014-01-04 VITALS — BP 120/84 | HR 80 | Resp 16 | Ht 66.0 in | Wt 202.0 lb

## 2014-01-04 DIAGNOSIS — N63 Unspecified lump in breast: Secondary | ICD-10-CM

## 2014-01-04 DIAGNOSIS — D509 Iron deficiency anemia, unspecified: Secondary | ICD-10-CM

## 2014-01-04 DIAGNOSIS — J209 Acute bronchitis, unspecified: Secondary | ICD-10-CM

## 2014-01-04 DIAGNOSIS — N6310 Unspecified lump in the right breast, unspecified quadrant: Secondary | ICD-10-CM

## 2014-01-04 DIAGNOSIS — E669 Obesity, unspecified: Secondary | ICD-10-CM

## 2014-01-04 DIAGNOSIS — R7309 Other abnormal glucose: Secondary | ICD-10-CM

## 2014-01-04 DIAGNOSIS — I10 Essential (primary) hypertension: Secondary | ICD-10-CM

## 2014-01-04 DIAGNOSIS — J302 Other seasonal allergic rhinitis: Secondary | ICD-10-CM | POA: Insufficient documentation

## 2014-01-04 DIAGNOSIS — G43009 Migraine without aura, not intractable, without status migrainosus: Secondary | ICD-10-CM

## 2014-01-04 DIAGNOSIS — R7303 Prediabetes: Secondary | ICD-10-CM

## 2014-01-04 DIAGNOSIS — E559 Vitamin D deficiency, unspecified: Secondary | ICD-10-CM

## 2014-01-04 LAB — CBC WITH DIFFERENTIAL/PLATELET
BASOS PCT: 0 % (ref 0–1)
Basophils Absolute: 0 10*3/uL (ref 0.0–0.1)
Eosinophils Absolute: 0.2 10*3/uL (ref 0.0–0.7)
Eosinophils Relative: 3 % (ref 0–5)
HEMATOCRIT: 34 % — AB (ref 36.0–46.0)
Hemoglobin: 11.3 g/dL — ABNORMAL LOW (ref 12.0–15.0)
LYMPHS ABS: 1.9 10*3/uL (ref 0.7–4.0)
Lymphocytes Relative: 28 % (ref 12–46)
MCH: 24.2 pg — ABNORMAL LOW (ref 26.0–34.0)
MCHC: 33.2 g/dL (ref 30.0–36.0)
MCV: 73 fL — ABNORMAL LOW (ref 78.0–100.0)
MONOS PCT: 9 % (ref 3–12)
MPV: 9.1 fL — AB (ref 9.4–12.4)
Monocytes Absolute: 0.6 10*3/uL (ref 0.1–1.0)
NEUTROS ABS: 4 10*3/uL (ref 1.7–7.7)
Neutrophils Relative %: 60 % (ref 43–77)
Platelets: 341 10*3/uL (ref 150–400)
RBC: 4.66 MIL/uL (ref 3.87–5.11)
RDW: 16 % — ABNORMAL HIGH (ref 11.5–15.5)
WBC: 6.7 10*3/uL (ref 4.0–10.5)

## 2014-01-04 LAB — IRON: IRON: 32 ug/dL — AB (ref 42–145)

## 2014-01-04 MED ORDER — FLUTICASONE PROPIONATE 50 MCG/ACT NA SUSP: 2.0000 | Freq: Every day | NASAL | Status: DC

## 2014-01-04 MED ORDER — AZITHROMYCIN 250 MG PO TABS: ORAL_TABLET | ORAL | Status: DC

## 2014-01-04 MED ORDER — BENZONATATE 100 MG PO CAPS: 100.0000 mg | ORAL_CAPSULE | Freq: Two times a day (BID) | ORAL | Status: DC | PRN

## 2014-01-04 MED ORDER — PHENTERMINE HCL 37.5 MG PO TABS: 37.5000 mg | ORAL_TABLET | Freq: Every day | ORAL | Status: DC

## 2014-01-04 NOTE — Patient Instructions (Addendum)
F/u in 3 month, call if you need me before  Z pack and tessalon perles prescribed for bronchitis  Start daily flonase for allergies  Get appt for imaging of your right breast which you NEED as soon as possible  Labs today, HBA1C and CBc and iron and ferritin   It is important that you exercise regularly at least 30 minutes 5 times a week. If you develop chest pain, have severe difficulty breathing, or feel very tired, stop exercising immediately and seek medical attention   Start phentermine one daily to help with weight loss

## 2014-01-05 ENCOUNTER — Encounter: Payer: Self-pay | Admitting: Family Medicine

## 2014-01-05 LAB — HEMOGLOBIN A1C
Hgb A1c MFr Bld: 6.2 % — ABNORMAL HIGH
Mean Plasma Glucose: 131 mg/dL — ABNORMAL HIGH

## 2014-01-05 LAB — FERRITIN: Ferritin: 28 ng/mL (ref 10–291)

## 2014-01-05 NOTE — Assessment & Plan Note (Signed)
Deteriorated. Patient re-educated about  the importance of commitment to a  minimum of 150 minutes of exercise per week. The importance of healthy food choices with portion control discussed. Encouraged to start a food diary, count calories and to consider  joining a support group. Sample diet sheets offered. Goals set by the patient for the next several months.  Resume phentermine daily

## 2014-01-05 NOTE — Assessment & Plan Note (Signed)
Reports aware of lump in past 5 days. Has had abnormal mammogram since 2013 and did not follow up with re imaging recommended, urgent imaging needed, she understands and will follow through

## 2014-01-05 NOTE — Assessment & Plan Note (Signed)
Decongestant and antibiotic prescribed 

## 2014-01-05 NOTE — Assessment & Plan Note (Signed)
Uncontrolled and increased in past 4 weeks, start daily flonase

## 2014-01-05 NOTE — Assessment & Plan Note (Signed)
Reports improvement with reduced stress  Only using oTC excedrin migraine , not using any prescription med

## 2014-01-05 NOTE — Assessment & Plan Note (Signed)
Weekly vit d Updated lab needed at/ before next visit.

## 2014-01-05 NOTE — Assessment & Plan Note (Signed)
Patient educated about the importance of limiting  Carbohydrate intake , the need to commit to daily physical activity for a minimum of 30 minutes , and to commit weight loss. The fact that changes in all these areas will reduce or eliminate all together the development of diabetes is stressed.   Updated lab needed at/ before next visit.  

## 2014-01-05 NOTE — Progress Notes (Signed)
   Subjective:    Patient ID: Erin Forbes, female    DOB: May 11, 1970, 43 y.o.   MRN: [id number]  HPI The PT is here for follow up and re-evaluation of chronic medical conditions, medication management and review of any available recent lab and radiology data.  Preventive health is updated, specifically  Cancer screening and Immunization.   5 day h/o palpable non tender right breast mass,  Missed mammogram last year and has had abnormal mamogram in the past 1 week h/o increased post nasal drainage and nasal congestion as well as cough and chest congestion, sputum is thick and cream, has had chills but no documented fever      Review of Systems See HPI  Denies chest pains, palpitations and leg swelling Denies abdominal pain, nausea, vomiting,diarrhea or constipation.   Denies dysuria, frequency, hesitancy or incontinence. Denies joint pain, swelling and limitation in mobility. Denies uncontrolled headaches, seizures, numbness, or tingling. Denies depression, anxiety or insomnia. Denies skin break down or rash.        Objective:   Physical Exam BP 120/84 mmHg  Pulse 80  Resp 16  Ht 5\' 6"  (1.676 m)  Wt 202 lb (91.627 kg)  BMI 32.62 kg/m2  SpO2 98% Patient alert and oriented and in no cardiopulmonary distress.  HEENT: No facial asymmetry, EOMI,   oropharynx pink and moist.  Neck supple no JVD, no mass. Breast: right breast approx 3cm firm mass at 3 o clock , no nipple d/c ao axillary or supraclavicular adenopathy, left breast exam no mass, nipple d/c or inversion, no axillary or supraclavicular adenopathy Chest: Adequate air entry , few basilar crackles, no wheezes CVS: S1, S2 no murmurs, no S3.Regular rate.  ABD: Soft non tender.   Ext: No edema  MS: Adequate ROM spine, shoulders, hips and knees.  Skin: Intact, no ulcerations or rash noted.  Psych: Good eye contact, normal affect. Memory intact not anxious or depressed appearing.  CNS: CN 2-12 intact, power,   normal throughout.no focal deficits noted.        Assessment & Plan:  Mass of breast, right Reports aware of lump in past 5 days. Has had abnormal mammogram since 2013 and did not follow up with re imaging recommended, urgent imaging needed, she understands and will follow through  Obese Deteriorated. Patient re-educated about  the importance of commitment to a  minimum of 150 minutes of exercise per week. The importance of healthy food choices with portion control discussed. Encouraged to start a food diary, count calories and to consider  joining a support group. Sample diet sheets offered. Goals set by the patient for the next several months.  Resume phentermine daily   Prediabetes Patient educated about the importance of limiting  Carbohydrate intake , the need to commit to daily physical activity for a minimum of 30 minutes , and to commit weight loss. The fact that changes in all these areas will reduce or eliminate all together the development of diabetes is stressed.   Updated lab needed at/ before next visit.   Vitamin D deficiency Weekly vit d Updated lab needed at/ before next visit.   Acute bronchitis Decongestant and antibiotic prescribed  Migraine headache without aura Reports improvement with reduced stress  Only using oTC excedrin migraine , not using any prescription med  Seasonal allergies Uncontrolled and increased in past 4 weeks, start daily flonase

## 2014-01-11 ENCOUNTER — Other Ambulatory Visit: Payer: Self-pay | Admitting: Family Medicine

## 2014-01-11 ENCOUNTER — Other Ambulatory Visit: Payer: Self-pay

## 2014-01-11 DIAGNOSIS — N6310 Unspecified lump in the right breast, unspecified quadrant: Secondary | ICD-10-CM

## 2014-01-18 ENCOUNTER — Ambulatory Visit
Admission: RE | Admit: 2014-01-18 | Discharge: 2014-01-18 | Disposition: A | Discharge status: Home / Self Care | Payer: Managed Care, Other (non HMO) | Source: Ambulatory Visit | Attending: Family Medicine | Admitting: Family Medicine

## 2014-01-18 ENCOUNTER — Other Ambulatory Visit: Payer: Managed Care, Other (non HMO)

## 2014-01-18 DIAGNOSIS — N6310 Unspecified lump in the right breast, unspecified quadrant: Secondary | ICD-10-CM

## 2014-03-30 ENCOUNTER — Encounter: Payer: Self-pay | Admitting: Family Medicine

## 2014-03-30 ENCOUNTER — Ambulatory Visit (INDEPENDENT_AMBULATORY_CARE_PROVIDER_SITE_OTHER): Payer: BLUE CROSS/BLUE SHIELD | Admitting: Family Medicine

## 2014-03-30 VITALS — BP 124/82 | HR 85 | Temp 98.5°F | Resp 16 | Ht 66.0 in | Wt 200.0 lb

## 2014-03-30 DIAGNOSIS — R7309 Other abnormal glucose: Secondary | ICD-10-CM

## 2014-03-30 DIAGNOSIS — J302 Other seasonal allergic rhinitis: Secondary | ICD-10-CM | POA: Diagnosis not present

## 2014-03-30 DIAGNOSIS — J029 Acute pharyngitis, unspecified: Secondary | ICD-10-CM | POA: Diagnosis not present

## 2014-03-30 DIAGNOSIS — E559 Vitamin D deficiency, unspecified: Secondary | ICD-10-CM

## 2014-03-30 DIAGNOSIS — R7303 Prediabetes: Secondary | ICD-10-CM

## 2014-03-30 DIAGNOSIS — E669 Obesity, unspecified: Secondary | ICD-10-CM | POA: Diagnosis not present

## 2014-03-30 DIAGNOSIS — Z1159 Encounter for screening for other viral diseases: Secondary | ICD-10-CM

## 2014-03-30 DIAGNOSIS — J209 Acute bronchitis, unspecified: Secondary | ICD-10-CM

## 2014-03-30 DIAGNOSIS — Z13220 Encounter for screening for lipoid disorders: Secondary | ICD-10-CM

## 2014-03-30 DIAGNOSIS — D509 Iron deficiency anemia, unspecified: Secondary | ICD-10-CM

## 2014-03-30 LAB — POCT RAPID STREP A (OFFICE): Rapid Strep A Screen: NEGATIVE

## 2014-03-30 MED ORDER — LORATADINE 10 MG PO TABS: 10.0000 mg | ORAL_TABLET | Freq: Every day | ORAL | Status: DC

## 2014-03-30 MED ORDER — BENZONATATE 100 MG PO CAPS: 100.0000 mg | ORAL_CAPSULE | Freq: Two times a day (BID) | ORAL | Status: DC | PRN

## 2014-03-30 MED ORDER — PHENTERMINE HCL 37.5 MG PO TABS: 37.5000 mg | ORAL_TABLET | Freq: Every day | ORAL | Status: DC

## 2014-03-30 MED ORDER — AZITHROMYCIN 250 MG PO TABS: ORAL_TABLET | ORAL | Status: DC

## 2014-03-30 MED ORDER — PREDNISONE 5 MG PO TABS: 5.0000 mg | ORAL_TABLET | Freq: Two times a day (BID) | ORAL | Status: DC

## 2014-03-30 MED ORDER — FLUTICASONE PROPIONATE 50 MCG/ACT NA SUSP: 2.0000 | Freq: Every day | NASAL | Status: DC

## 2014-03-30 MED ORDER — PROMETHAZINE-DM 6.25-15 MG/5ML PO SYRP: 5.0000 mL | ORAL_SOLUTION | Freq: Four times a day (QID) | ORAL | Status: DC | PRN

## 2014-03-30 NOTE — Patient Instructions (Signed)
F/u in 3 month, call if you need me before  You are treated for acute bronchitis and uncontrolled allergies, medications are sent as discussed  Work  Consistently on weight loss by changing eating habits please  CBC, fasting  Lipid. Chem 7 , HBa1C , TSH , vit D in 3 month and HIV

## 2014-04-01 DIAGNOSIS — J029 Acute pharyngitis, unspecified: Secondary | ICD-10-CM | POA: Insufficient documentation

## 2014-04-01 NOTE — Assessment & Plan Note (Signed)
Symptomatic, however rapid strep test is negative

## 2014-04-01 NOTE — Assessment & Plan Note (Signed)
Patient educated about the importance of limiting  Carbohydrate intake , the need to commit to daily physical activity for a minimum of 30 minutes , and to commit weight loss. The fact that changes in all these areas will reduce or eliminate all together the development of diabetes is stressed.   Updated lab needed at/ before next visit.  

## 2014-04-01 NOTE — Assessment & Plan Note (Signed)
Deteriorated. Patient re-educated about  the importance of commitment to a  minimum of 150 minutes of exercise per week. The importance of healthy food choices with portion control discussed. Encouraged to start a food diary, count calories and to consider  joining a support group. Sample diet sheets offered. Goals set by the patient for the next several months.    

## 2014-04-01 NOTE — Assessment & Plan Note (Signed)
Uncontrolled start daly med

## 2014-04-01 NOTE — Progress Notes (Signed)
   Subjective:    Patient ID: Erin Forbes, female    DOB: [birth year], 44 y.o.   MRN: [id number]  HPI The PT is here for follow up and re-evaluation of chronic medical conditions, medication management and review of any available recent lab and radiology data.  Preventive health is updated, specifically  Cancer screening and Immunization.   Questions or concerns regarding consultations or procedures which the PT has had in the interim are  addressed. The PT denies any adverse reactions to current medications since the last visit.  3 day  h/o excessive chest congestion, cough productive of thick yellow sputum, fever and chills.Chest sore from excessive cough. Increasing malaise , poor sleep due to excessive cough, and reduced  appetite. Increased sinus pressure, nasal congestion and clear  Drainage,c/o bilateral   ear pressure and  sore throat.No documented fever but has experienced chills Concerned about weight ain and intends to be more diligent about thii     Review of Systems See HPI  Denies chest pains, palpitations and leg swelling Denies abdominal pain, nausea, vomiting,diarrhea or constipation.   Denies dysuria, frequency, hesitancy or incontinence. Denies joint pain, swelling and limitation in mobility. Denies headaches, seizures, numbness, or tingling. Denies depression, anxiety or insomnia. Denies skin break down or rash.        Objective:   Physical Exam BP 124/82 mmHg  Pulse 85  Temp(Src) 98.5 F (36.9 C) (Oral)  Resp 16  Ht 5\' 6"  (1.676 m)  Wt 200 lb (90.719 kg)  BMI 32.30 kg/m2  SpO2 99% Patient alert and oriented and in no cardiopulmonary distress.  HEENT: No facial asymmetry, EOMI,   oropharynx mildly erythematous,no exudate, and moist.  Neck supple no JVD, no mass.No  Adenopathy,sinuses non tender, erythema and edema of nasal mucosa  Chest: adequate air entry , few bi basilar crackles, no wheezes  CVS: S1, S2 no murmurs, no S3.Regular rate.  ABD:  Soft non tender.   Ext: No edema  MS: Adequate ROM spine, shoulders, hips and knees.  Skin: Intact, no ulcerations or rash noted.  Psych: Good eye contact, normal affect. Memory intact not anxious or depressed appearing.  CNS: CN 2-12 intact, power,  normal throughout.no focal deficits noted.        Assessment & Plan:  Acute bronchitis Antibiotic and decongestant prescribed.     Seasonal allergies Uncontrolled start daly med   Prediabetes Patient educated about the importance of limiting  Carbohydrate intake , the need to commit to daily physical activity for a minimum of 30 minutes , and to commit weight loss. The fact that changes in all these areas will reduce or eliminate all together the development of diabetes is stressed.   Updated lab needed at/ before next visit.    Obese Deteriorated. Patient re-educated about  the importance of commitment to a  minimum of 150 minutes of exercise per week. The importance of healthy food choices with portion control discussed. Encouraged to start a food diary, count calories and to consider  joining a support group. Sample diet sheets offered. Goals set by the patient for the next several months.      Vitamin D deficiency Updated lab needed at/ before next visit.     Acute pharyngitis Symptomatic, however rapid strep test is negative

## 2014-04-01 NOTE — Assessment & Plan Note (Signed)
Antibiotic and decongestant prescribed 

## 2014-04-01 NOTE — Assessment & Plan Note (Signed)
Updated lab needed at/ before next visit.   

## 2014-04-21 ENCOUNTER — Ambulatory Visit: Payer: Managed Care, Other (non HMO) | Admitting: Family Medicine

## 2014-07-10 ENCOUNTER — Ambulatory Visit: Payer: BLUE CROSS/BLUE SHIELD | Admitting: Family Medicine

## 2014-07-11 ENCOUNTER — Encounter: Payer: Self-pay | Admitting: Family Medicine

## 2014-07-21 ENCOUNTER — Emergency Department (HOSPITAL_BASED_OUTPATIENT_CLINIC_OR_DEPARTMENT_OTHER)
Admission: EM | Admit: 2014-07-21 | Discharge: 2014-07-21 | Disposition: A | Discharge status: Home / Self Care | Payer: BLUE CROSS/BLUE SHIELD | Attending: Emergency Medicine | Admitting: Emergency Medicine

## 2014-07-21 ENCOUNTER — Encounter (HOSPITAL_BASED_OUTPATIENT_CLINIC_OR_DEPARTMENT_OTHER): Payer: Self-pay | Admitting: *Deleted

## 2014-07-21 DIAGNOSIS — G479 Sleep disorder, unspecified: Secondary | ICD-10-CM | POA: Diagnosis not present

## 2014-07-21 DIAGNOSIS — M2662 Arthralgia of temporomandibular joint: Secondary | ICD-10-CM | POA: Diagnosis not present

## 2014-07-21 DIAGNOSIS — Z7951 Long term (current) use of inhaled steroids: Secondary | ICD-10-CM | POA: Insufficient documentation

## 2014-07-21 DIAGNOSIS — K088 Other specified disorders of teeth and supporting structures: Secondary | ICD-10-CM | POA: Insufficient documentation

## 2014-07-21 DIAGNOSIS — M26629 Arthralgia of temporomandibular joint, unspecified side: Secondary | ICD-10-CM

## 2014-07-21 DIAGNOSIS — R6884 Jaw pain: Secondary | ICD-10-CM | POA: Diagnosis present

## 2014-07-21 DIAGNOSIS — K0889 Other specified disorders of teeth and supporting structures: Secondary | ICD-10-CM

## 2014-07-21 MED ORDER — CYCLOBENZAPRINE HCL 10 MG PO TABS: 10.0000 mg | ORAL_TABLET | Freq: Two times a day (BID) | ORAL | Status: DC | PRN

## 2014-07-21 MED ORDER — PENICILLIN V POTASSIUM 500 MG PO TABS: 500.0000 mg | ORAL_TABLET | Freq: Four times a day (QID) | ORAL | Status: DC

## 2014-07-21 NOTE — ED Notes (Signed)
C/o pain in lower left jaw from below mouth area to left in front of ear. Unsure if she is having tooth pain.

## 2014-07-21 NOTE — Discharge Instructions (Signed)
Dental Pain A tooth ache may be caused by cavities (tooth decay). Cavities expose the nerve of the tooth to air and hot or cold temperatures. It may come from an infection or abscess (also called a boil or furuncle) around your tooth. It is also often caused by dental caries (tooth decay). This causes the pain you are having. DIAGNOSIS  Your caregiver can diagnose this problem by exam. TREATMENT   If caused by an infection, it may be treated with medications which kill germs (antibiotics) and pain medications as prescribed by your caregiver. Take medications as directed.  Only take over-the-counter or prescription medicines for pain, discomfort, or fever as directed by your caregiver.  Whether the tooth ache today is caused by infection or dental disease, you should see your dentist as soon as possible for further care. SEEK MEDICAL CARE IF: The exam and treatment you received today has been provided on an emergency basis only. This is not a substitute for complete medical or dental care. If your problem worsens or new problems (symptoms) appear, and you are unable to meet with your dentist, call or return to this location. SEEK IMMEDIATE MEDICAL CARE IF:   You have a fever.  You develop redness and swelling of your face, jaw, or neck.  You are unable to open your mouth.  You have severe pain uncontrolled by pain medicine. MAKE SURE YOU:   Understand these instructions.  Will watch your condition.  Will get help right away if you are not doing well or get worse. Document Released: 01/06/2005 Document Revised: 03/31/2011 Document Reviewed: 08/25/2007 [facility] Patient Information 2015 [place], [place]. This information is not intended to replace advice given to you by your health care provider. Make sure you discuss any questions you have with your health care provider.  Temporomandibular Problems  Temporomandibular joint (TMJ) dysfunction means there are problems with the joint between  your jaw and your skull. This is a joint lined by cartilage like other joints in your body but also has a small disc in the joint which keeps the bones from rubbing on each other. These joints are like other joints and can get inflamed (sore) from arthritis and other problems. When this joint gets sore, it can cause headaches and pain in the jaw and the face. CAUSES  Usually the arthritic types of problems are caused by soreness in the joint. Soreness in the joint can also be caused by overuse. This may come from grinding your teeth. It may also come from mis-alignment in the joint. DIAGNOSIS Diagnosis of this condition can often be made by history and exam. Sometimes your caregiver may need X-rays or an MRI scan to determine the exact cause. It may be necessary to see your dentist to determine if your teeth and jaws are lined up correctly. TREATMENT  Most of the time this problem is not serious; however, sometimes it can persist (become chronic). When this happens medications that will cut down on inflammation (soreness) help. Sometimes a shot of cortisone into the joint will be helpful. If your teeth are not aligned it may help for your dentist to make a splint for your mouth that can help this problem. If no physical problems can be found, the problem may come from tension. If tension is found to be the cause, biofeedback or relaxation techniques may be helpful. HOME CARE INSTRUCTIONS   Later in the day, applications of ice packs may be helpful. Ice can be used in a plastic bag with  a towel around it to prevent frostbite to skin. This may be used about every 2 hours for 20 to 30 minutes, as needed while awake, or as directed by your caregiver.  Only take over-the-counter or prescription medicines for pain, discomfort, or fever as directed by your caregiver.  If physical therapy was prescribed, follow your caregiver's directions.  Wear mouth appliances as directed if they were given. Document  Released: 10/01/2000 Document Revised: 03/31/2011 Document Reviewed: 01/09/2008 [facility] Patient Information 2015 [place], [place]. This information is not intended to replace advice given to you by your health care provider. Make sure you discuss any questions you have with your health care provider.

## 2014-07-21 NOTE — ED Provider Notes (Signed)
CSN: [id number]     Arrival date & time 07/21/14  1507 History   First MD Initiated Contact with Patient 07/21/14 1532     Chief Complaint  Patient presents with  . Jaw Pain     (Consider location/radiation/quality/duration/timing/severity/associated sxs/prior Treatment) HPI Comments: Patient presents to the emergency department with chief complaint of left-sided jaw pain. She states that the pain started 2 days ago. She has tried taking Tylenol with mild relief. She reports pain that radiates to her jaw. She states that she made an appointment with her dentist, and is scheduled to see them next week. She denies any fevers or chills. She reports increased pain with chewing and jaw movement. There are no other associated symptoms.  The history is provided by the patient. No language interpreter was used.    Past Medical History  Diagnosis Date  . Headache 2000   Past Surgical History  Procedure Laterality Date  . Cholecystectomy    . Cesarean section  1995, 2002    two   Family History  Problem Relation Age of Onset  . Stroke Mother 52  . Hypertension Mother   . Hypertension Father   . Hypertension Sister   . Hypertension Brother    History  Substance Use Topics  . Smoking status: Never Smoker   . Smokeless tobacco: Not on file  . Alcohol Use: Yes   OB History    No data available     Review of Systems  Constitutional: Negative for fever and chills.  HENT: Positive for dental problem. Negative for drooling.   Respiratory: Negative for shortness of breath.   Cardiovascular: Negative for chest pain.  Gastrointestinal: Negative for nausea, vomiting, diarrhea and constipation.  Genitourinary: Negative for dysuria.  Neurological: Negative for speech difficulty.  Psychiatric/Behavioral: Positive for sleep disturbance.  All other systems reviewed and are negative.     Allergies  Review of patient's allergies indicates no known allergies.  Home Medications   Prior  to Admission medications   Medication Sig Start Date End Date Taking? Authorizing Provider  fluticasone (FLONASE) 50 MCG/ACT nasal spray Place 2 sprays into both nostrils daily. 03/30/14  Yes Fayrene Helper, MD  furosemide (LASIX) 20 MG tablet One tablet once daily, as needed, for leg swelling 08/25/13  Yes Fayrene Helper, MD   BP 107/65 mmHg  Pulse 78  Temp(Src) 98.4 F (36.9 C) (Oral)  Resp 16  Ht 5\' 5"  (1.651 m)  Wt 198 lb (89.812 kg)  BMI 32.95 kg/m2  SpO2 97%  LMP 07/12/2014 Physical Exam  Constitutional: She is oriented to person, place, and time. She appears well-developed and well-nourished.  HENT:  Head: Normocephalic and atraumatic.  No obvious abscess or dental caries, the patient does have some clicking with jaw movement  Eyes: Conjunctivae and EOM are normal.  Neck: Normal range of motion.  Cardiovascular: Normal rate.   Pulmonary/Chest: Effort normal.  Abdominal: She exhibits no distension.  Musculoskeletal: Normal range of motion.  Neurological: She is alert and oriented to person, place, and time.  Skin: Skin is dry.  Psychiatric: She has a normal mood and affect. Her behavior is normal. Judgment and thought content normal.  Nursing note and vitals reviewed.   ED Course  Procedures (including critical care time) Labs Review Labs Reviewed - No data to display  Imaging Review No results found.   EKG Interpretation None      MDM   Final diagnoses:  Pain, dental  TMJ arthralgia  Patient with dental pain versus TMJ. Will give a muscle relaxer and penicillin.  F/u with dentistry next week.  Patient understands and agrees with the plan.    Montine Circle, PA-C 07/21/14 1652  Quintella Reichert, MD 07/21/14 [id number]

## 2014-08-23 ENCOUNTER — Ambulatory Visit: Payer: BLUE CROSS/BLUE SHIELD | Admitting: Family Medicine

## 2014-08-23 ENCOUNTER — Encounter: Payer: Self-pay | Admitting: Family Medicine

## 2014-08-23 VITALS — BP 130/84 | HR 81 | Resp 16 | Ht 66.0 in | Wt 207.0 lb

## 2014-08-23 DIAGNOSIS — R7303 Prediabetes: Secondary | ICD-10-CM

## 2014-08-23 DIAGNOSIS — R7309 Other abnormal glucose: Secondary | ICD-10-CM

## 2014-08-23 DIAGNOSIS — E559 Vitamin D deficiency, unspecified: Secondary | ICD-10-CM

## 2014-08-23 DIAGNOSIS — D509 Iron deficiency anemia, unspecified: Secondary | ICD-10-CM | POA: Diagnosis not present

## 2014-08-23 DIAGNOSIS — E669 Obesity, unspecified: Secondary | ICD-10-CM

## 2014-08-23 DIAGNOSIS — G43009 Migraine without aura, not intractable, without status migrainosus: Secondary | ICD-10-CM

## 2014-08-23 DIAGNOSIS — M546 Pain in thoracic spine: Secondary | ICD-10-CM

## 2014-08-23 DIAGNOSIS — Z13220 Encounter for screening for lipoid disorders: Secondary | ICD-10-CM

## 2014-08-23 DIAGNOSIS — N62 Hypertrophy of breast: Secondary | ICD-10-CM | POA: Insufficient documentation

## 2014-08-23 DIAGNOSIS — D250 Submucous leiomyoma of uterus: Secondary | ICD-10-CM

## 2014-08-23 LAB — CBC WITH DIFFERENTIAL/PLATELET
BASOS PCT: 0 % (ref 0–1)
Basophils Absolute: 0 10*3/uL (ref 0.0–0.1)
Eosinophils Absolute: 0.1 10*3/uL (ref 0.0–0.7)
Eosinophils Relative: 1 % (ref 0–5)
HCT: 35.4 % — ABNORMAL LOW (ref 36.0–46.0)
HEMOGLOBIN: 11.5 g/dL — AB (ref 12.0–15.0)
Lymphocytes Relative: 31 % (ref 12–46)
Lymphs Abs: 2.8 10*3/uL (ref 0.7–4.0)
MCH: 24.6 pg — AB (ref 26.0–34.0)
MCHC: 32.5 g/dL (ref 30.0–36.0)
MCV: 75.8 fL — AB (ref 78.0–100.0)
MPV: 9 fL (ref 8.6–12.4)
Monocytes Absolute: 0.5 10*3/uL (ref 0.1–1.0)
Monocytes Relative: 5 % (ref 3–12)
NEUTROS ABS: 5.7 10*3/uL (ref 1.7–7.7)
Neutrophils Relative %: 63 % (ref 43–77)
Platelets: 338 10*3/uL (ref 150–400)
RBC: 4.67 MIL/uL (ref 3.87–5.11)
RDW: 16.5 % — ABNORMAL HIGH (ref 11.5–15.5)
WBC: 9.1 10*3/uL (ref 4.0–10.5)

## 2014-08-23 MED ORDER — KETOROLAC TROMETHAMINE 60 MG/2ML IM SOLN
60.0000 mg | Freq: Once | INTRAMUSCULAR | Status: AC
  Administered 2014-08-23: 60 mg via INTRAMUSCULAR

## 2014-08-23 MED ORDER — IBUPROFEN 800 MG PO TABS: ORAL_TABLET | ORAL | Status: DC

## 2014-08-23 NOTE — Patient Instructions (Addendum)
F/u in 4 months, call if you need me sooner  Labs today   10 pound weight loss goal  Please work on good  health habits so that your health will improve. 1. Commitment to daily physical activity for 30 to 60  minutes, if you are able to do this.  2. Commitment to wise food choices. Aim for half of your  food intake to be vegetable and fruit, one quarter starchy foods, and one quarter protein. Try to eat on a regular schedule  3 meals per day, snacking between meals should be limited to vegetables or fruits or small portions of nuts. 64 ounces of water per day is generally recommended, unless you have specific health conditions, like heart failure or kidney failure where you will need to limit fluid intake.  3. Commitment to sufficient and a  good quality of physical and mental rest daily, generally between 6 to 8 hours per day.  WITH PERSISTANCE AND PERSEVERANCE, THE IMPOSSIBLE , BECOMES THE NORM! You are referred to Dr Charlestine Night  I will order the special mammogram for Decmber for you on your return visit , it is due Decv 31 or after  Injection ion office for pain and medication sent in also

## 2014-08-23 NOTE — Progress Notes (Signed)
Subjective:    Patient ID: Erin Forbes, female    DOB: [birth year], 44 y.o.   MRN: [id number]  HPI Mid back pain and shoulder pain rated from 5 to 8 worsening x 1 year. Current bra size is 51 HH,was in DD 2 years ago, wants reduction C/o darkening of the skin of her shoulders and indentation C/o interference in normal daily activities due to chronic pain and at times sleep is disturbed Inconsistent exercise and also in healthy diet , with resultant weight gain since last visit, intends to address this    Review of Systems See HPI Denies recent fever or chills. Denies sinus pressure, nasal congestion, ear pain or sore throat. Denies chest congestion, productive cough or wheezing. Denies chest pains, palpitations and leg swelling Denies abdominal pain, nausea, vomiting,diarrhea or constipation.   Denies dysuria, frequency, hesitancy or incontinence.  Denies headaches, seizures, numbness, or tingling. Denies depression, anxiety or insomnia. Denies skin break down or rash.        Objective:   Physical Exam BP 130/84 mmHg  Pulse 81  Resp 16  Ht 5\' 6"  (1.676 m)  Wt 207 lb (93.895 kg)  BMI 33.43 kg/m2  SpO2 100% Patient alert and oriented and in no cardiopulmonary distress.Patient in pain  HEENT: No facial asymmetry, EOMI,   oropharynx pink and moist.  Neck supple no JVD, no mass.  Chest: Clear to auscultation bilaterally. Breasts: bilateral macromastia CVS: S1, S2 no murmurs, no S3.Regular rate.  ABD: Soft non tender.   Ext: No edema  MS: Decreased ROM thoracic  Spine and , shoulders, adequate in  hips and knees.  Skin: Intact, hyperpigmentation of skin over shouders  Psych: Good eye contact, normal affect. Memory intact not anxious or depressed appearing.  CNS: CN 2-12 intact, power,  normal throughout.no focal deficits noted.        Assessment & Plan:   Macromastia Bra size up  From DD to [facility] , with chronic thoracic back pain rated at an 8, limiting  daily activities ann disturbing sleep,needs breast reduction surgery for improved quality of life. Letter to be prepared for her insurance carrier stating the medical benefit and necessity of surgical intervention for this patient    Vitamin D deficiency Updated lab needed at/ before next visit. Med compliance is stressed  Anemia, iron deficiency Non compliant with medication and still has heavy cycles. Updated lab needed will direct re supplement need once lab is available.   Obese Deteriorated. Patient re-educated about  the importance of commitment to a  minimum of 150 minutes of exercise per week.  The importance of healthy food choices with portion control discussed. Encouraged to start a food diary, count calories and to consider  joining a support group. Sample diet sheets offered. Goals set by the patient for the next several months.   Weight /BMI 08/23/2014 07/21/2014 03/30/2014  WEIGHT 207 lb 198 lb 200 lb  HEIGHT 5\' 6"  5\' 5"  5\' 6"   BMI 33.43 kg/m2 32.95 kg/m2 32.3 kg/m2    Current exercise per week 90 minutes.   Prediabetes Patient educated about the importance of limiting  Carbohydrate intake , the need to commit to daily physical activity for a minimum of 30 minutes , and to commit weight loss. The fact that changes in all these areas will reduce or eliminate all together the development of diabetes is stressed.  Improved, despite weight gain  Diabetic Labs Latest Ref Rng 08/23/2014 01/04/2014 02/24/2013 02/25/2012 07/18/2011  HbA1c <5.7 %  6.1(H) 6.2(H) 5.9(H) 5.8(H) -  Chol 125 - 200 mg/dL 114(L) - 103 109 -  HDL >=46 mg/dL 40(L) - 42 42 -  Calc LDL <130 mg/dL 41 - 48 56 -  Triglycerides <150 mg/dL 163(H) - 67 53 -  Creatinine 0.50 - 1.10 mg/dL 0.73 - 0.72 0.77 0.71   BP/Weight 08/23/2014 07/21/2014 03/30/2014 01/04/2014 06/27/2013 02/24/2013 32/07/6145  Systolic BP 092 [phone]  Diastolic BP 84 65 82 84 80 80 82  Wt. (Lbs) 207 198 200 202 192 194 194.4  BMI  33.43 32.95 32.3 32.62 31 31.33 31.39   No flowsheet data found.     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 Recommend bilateral breast reduction

## 2014-08-24 ENCOUNTER — Telehealth: Payer: Self-pay | Admitting: *Deleted

## 2014-08-24 LAB — FERRITIN: Ferritin: 21 ng/mL (ref 10–291)

## 2014-08-24 LAB — LIPID PANEL
Cholesterol: 114 mg/dL — ABNORMAL LOW (ref 125–200)
HDL: 40 mg/dL — ABNORMAL LOW (ref >=46)
LDL CALC: 41 mg/dL (ref <130)
Total CHOL/HDL Ratio: 2.9 Ratio (ref <=5.0)
Triglycerides: 163 mg/dL — ABNORMAL HIGH (ref <150)
VLDL: 33 mg/dL — ABNORMAL HIGH (ref <30)

## 2014-08-24 LAB — BASIC METABOLIC PANEL
BUN: 7 mg/dL (ref 7–25)
CHLORIDE: 103 mmol/L (ref 98–110)
CO2: 26 mmol/L (ref 20–31)
Calcium: 9.6 mg/dL (ref 8.6–10.2)
Creat: 0.73 mg/dL (ref 0.50–1.10)
Glucose, Bld: 94 mg/dL (ref 65–99)
POTASSIUM: 3.8 mmol/L (ref 3.5–5.3)
Sodium: 140 mmol/L (ref 135–146)

## 2014-08-24 LAB — TSH: TSH: 4.983 u[IU]/mL — ABNORMAL HIGH (ref 0.350–4.500)

## 2014-08-24 LAB — HEMOGLOBIN A1C
Hgb A1c MFr Bld: 6.1 % — ABNORMAL HIGH (ref <5.7)
Mean Plasma Glucose: 128 mg/dL — ABNORMAL HIGH (ref <117)

## 2014-08-24 LAB — IRON: IRON: 28 ug/dL — AB (ref 42–145)

## 2014-08-24 LAB — VITAMIN D 25 HYDROXY (VIT D DEFICIENCY, FRACTURES): Vit D, 25-Hydroxy: 21 ng/mL — ABNORMAL LOW (ref 30–100)

## 2014-08-24 NOTE — Telephone Encounter (Signed)
I called Dr Tamsen Snider office patient was seen there 08/16/14 they stated they need OV notes discussing breast reduction, they also need a letter from Dr. Moshe Cipro stating the need for the surgery, they have to send this off to [facility] for approval for the surgery. Fax number to Dr. Tamsen Snider office is [phone].

## 2014-08-24 NOTE — Telephone Encounter (Signed)
I called.

## 2014-08-25 NOTE — Assessment & Plan Note (Signed)
Bra size up  From DD to [facility] , with chronic thoracic back pain rated at an 8, limiting daily activities ann disturbing sleep,needs breast reduction surgery for improved quality of life. Letter to be prepared for her insurance carrier stating the medical benefit and necessity of surgical intervention for this patient

## 2014-08-25 NOTE — Assessment & Plan Note (Signed)
Reports flooding 3 days of a 5 day cycle, no interest in ablation at this time

## 2014-08-25 NOTE — Telephone Encounter (Signed)
Letter typed

## 2014-08-25 NOTE — Assessment & Plan Note (Signed)
Uncontrol;led and worsening due to macromastia, Toradol administered at visit. Recommend bilateral breast reduction

## 2014-08-25 NOTE — Assessment & Plan Note (Signed)
Improved in terms of frequency and severity, uses ibuprofen for relief  Counseled to use sparingly

## 2014-08-25 NOTE — Assessment & Plan Note (Signed)
Updated lab needed at/ before next visit. Med compliance is stressed

## 2014-08-25 NOTE — Assessment & Plan Note (Signed)
Non compliant with medication and still has heavy cycles. Updated lab needed will direct re supplement need once lab is available.

## 2014-08-25 NOTE — Telephone Encounter (Signed)
Please type a letter stating that it is my medical opinion that pt requires breast reduction surgery due to chronic upper back rated at an 8, and bilateralshoulder pain with indentation and hyperpigmentation of shoulders Current bra size is 32 HH I will sign the letter once completed then letter to be faxed to number below.to  Dr Lennette Bihari Thanks

## 2014-08-25 NOTE — Assessment & Plan Note (Signed)
Deteriorated. Patient re-educated about  the importance of commitment to a  minimum of 150 minutes of exercise per week.  The importance of healthy food choices with portion control discussed. Encouraged to start a food diary, count calories and to consider  joining a support group. Sample diet sheets offered. Goals set by the patient for the next several months.   Weight /BMI 08/23/2014 07/21/2014 03/30/2014  WEIGHT 207 lb 198 lb 200 lb  HEIGHT 5\' 6"  5\' 5"  5\' 6"   BMI 33.43 kg/m2 32.95 kg/m2 32.3 kg/m2    Current exercise per week 90 minutes.

## 2014-08-25 NOTE — Assessment & Plan Note (Signed)
Patient educated about the importance of limiting  Carbohydrate intake , the need to commit to daily physical activity for a minimum of 30 minutes , and to commit weight loss. The fact that changes in all these areas will reduce or eliminate all together the development of diabetes is stressed.  Improved, despite weight gain  Diabetic Labs Latest Ref Rng 08/23/2014 01/04/2014 02/24/2013 02/25/2012 07/18/2011  HbA1c <5.7 % 6.1(H) 6.2(H) 5.9(H) 5.8(H) -  Chol 125 - 200 mg/dL 114(L) - 103 109 -  HDL >=46 mg/dL 40(L) - 42 42 -  Calc LDL <130 mg/dL 41 - 48 56 -  Triglycerides <150 mg/dL 163(H) - 67 53 -  Creatinine 0.50 - 1.10 mg/dL 0.73 - 0.72 0.77 0.71   BP/Weight 08/23/2014 07/21/2014 03/30/2014 01/04/2014 06/27/2013 02/24/2013 61/04/7090  Systolic BP 957 473 403 709 643 838 184  Diastolic BP 84 65 82 84 80 80 82  Wt. (Lbs) 207 198 200 202 192 194 194.4  BMI 33.43 32.95 32.3 32.62 31 31.33 31.39   No flowsheet data found.

## 2014-09-09 ENCOUNTER — Other Ambulatory Visit: Payer: Self-pay | Admitting: Family Medicine

## 2014-10-20 ENCOUNTER — Encounter (HOSPITAL_BASED_OUTPATIENT_CLINIC_OR_DEPARTMENT_OTHER): Payer: Self-pay | Admitting: *Deleted

## 2014-10-25 ENCOUNTER — Ambulatory Visit: Payer: Self-pay | Admitting: Specialist

## 2014-10-25 ENCOUNTER — Other Ambulatory Visit: Payer: Self-pay | Admitting: Specialist

## 2014-10-30 ENCOUNTER — Encounter (HOSPITAL_BASED_OUTPATIENT_CLINIC_OR_DEPARTMENT_OTHER): Payer: Self-pay

## 2014-10-30 ENCOUNTER — Encounter (HOSPITAL_BASED_OUTPATIENT_CLINIC_OR_DEPARTMENT_OTHER)
Admission: RE | Disposition: A | Discharge status: Home / Self Care | Payer: Self-pay | Source: Ambulatory Visit | Attending: Specialist

## 2014-10-30 ENCOUNTER — Ambulatory Visit (HOSPITAL_BASED_OUTPATIENT_CLINIC_OR_DEPARTMENT_OTHER): Payer: BLUE CROSS/BLUE SHIELD | Admitting: Anesthesiology

## 2014-10-30 ENCOUNTER — Ambulatory Visit (HOSPITAL_BASED_OUTPATIENT_CLINIC_OR_DEPARTMENT_OTHER)
Admission: RE | Admit: 2014-10-30 | Discharge: 2014-10-30 | Disposition: A | Discharge status: Home / Self Care | Payer: BLUE CROSS/BLUE SHIELD | Source: Ambulatory Visit | Attending: Specialist | Admitting: Specialist

## 2014-10-30 DIAGNOSIS — N62 Hypertrophy of breast: Secondary | ICD-10-CM | POA: Diagnosis not present

## 2014-10-30 DIAGNOSIS — Z6833 Body mass index (BMI) 33.0-33.9, adult: Secondary | ICD-10-CM | POA: Diagnosis not present

## 2014-10-30 DIAGNOSIS — Q831 Accessory breast: Secondary | ICD-10-CM | POA: Diagnosis not present

## 2014-10-30 HISTORY — PX: MASS EXCISION: SHX2000

## 2014-10-30 HISTORY — PX: BREAST REDUCTION SURGERY: SHX8

## 2014-10-30 LAB — POCT HEMOGLOBIN-HEMACUE: HEMOGLOBIN: 10.3 g/dL — AB (ref 12.0–15.0)

## 2014-10-30 SURGERY — EXCISION MASS
Anesthesia: General | Site: Breast | Laterality: Bilateral

## 2014-10-30 MED ORDER — LACTATED RINGERS IV SOLN: INTRAVENOUS | Status: DC

## 2014-10-30 MED ORDER — DEXAMETHASONE SODIUM PHOSPHATE 10 MG/ML IJ SOLN
INTRAMUSCULAR | Status: AC
  Filled 2014-10-30: qty 1

## 2014-10-30 MED ORDER — MORPHINE SULFATE (PF) 10 MG/ML IV SOLN
INTRAVENOUS | Status: AC
  Filled 2014-10-30: qty 1

## 2014-10-30 MED ORDER — SODIUM CHLORIDE 0.9 % IV SOLN
INTRAVENOUS | Status: DC | PRN
  Administered 2014-10-30: 400 mL via INTRAMUSCULAR

## 2014-10-30 MED ORDER — SUCCINYLCHOLINE CHLORIDE 20 MG/ML IJ SOLN
INTRAMUSCULAR | Status: AC
  Filled 2014-10-30: qty 1

## 2014-10-30 MED ORDER — EPHEDRINE SULFATE 50 MG/ML IJ SOLN
INTRAMUSCULAR | Status: DC | PRN
  Administered 2014-10-30: 10 mg via INTRAVENOUS

## 2014-10-30 MED ORDER — LIDOCAINE HCL (CARDIAC) 20 MG/ML IV SOLN
INTRAVENOUS | Status: AC
  Filled 2014-10-30: qty 5

## 2014-10-30 MED ORDER — LIDOCAINE-EPINEPHRINE 0.5 %-1:200000 IJ SOLN
INTRAMUSCULAR | Status: AC
  Filled 2014-10-30: qty 2

## 2014-10-30 MED ORDER — ONDANSETRON HCL 4 MG/2ML IJ SOLN
INTRAMUSCULAR | Status: AC
  Filled 2014-10-30: qty 2

## 2014-10-30 MED ORDER — MIDAZOLAM HCL 5 MG/5ML IJ SOLN
INTRAMUSCULAR | Status: DC | PRN
  Administered 2014-10-30: 2 mg via INTRAVENOUS

## 2014-10-30 MED ORDER — DEXAMETHASONE SODIUM PHOSPHATE 4 MG/ML IJ SOLN
INTRAMUSCULAR | Status: DC | PRN
  Administered 2014-10-30: 10 mg via INTRAVENOUS

## 2014-10-30 MED ORDER — CEFAZOLIN SODIUM-DEXTROSE 2-3 GM-% IV SOLR
INTRAVENOUS | Status: DC | PRN
  Administered 2014-10-30: 2 g via INTRAVENOUS

## 2014-10-30 MED ORDER — PHENYLEPHRINE 40 MCG/ML (10ML) SYRINGE FOR IV PUSH (FOR BLOOD PRESSURE SUPPORT)
PREFILLED_SYRINGE | INTRAVENOUS | Status: AC
  Filled 2014-10-30: qty 10

## 2014-10-30 MED ORDER — ONDANSETRON HCL 4 MG/2ML IJ SOLN
INTRAMUSCULAR | Status: DC | PRN
  Administered 2014-10-30: 4 mg via INTRAVENOUS

## 2014-10-30 MED ORDER — PHENYLEPHRINE HCL 10 MG/ML IJ SOLN
INTRAMUSCULAR | Status: DC | PRN
  Administered 2014-10-30: 80 ug via INTRAVENOUS
  Administered 2014-10-30 (×2): 40 ug via INTRAVENOUS

## 2014-10-30 MED ORDER — GLYCOPYRROLATE 0.2 MG/ML IJ SOLN: 0.2000 mg | Freq: Once | INTRAMUSCULAR | Status: DC | PRN

## 2014-10-30 MED ORDER — SCOPOLAMINE 1 MG/3DAYS TD PT72: 1.0000 | MEDICATED_PATCH | Freq: Once | TRANSDERMAL | Status: DC | PRN

## 2014-10-30 MED ORDER — HYDROMORPHONE HCL 1 MG/ML IJ SOLN
INTRAMUSCULAR | Status: AC
  Filled 2014-10-30: qty 1

## 2014-10-30 MED ORDER — SUCCINYLCHOLINE CHLORIDE 20 MG/ML IJ SOLN
INTRAMUSCULAR | Status: DC | PRN
  Administered 2014-10-30: 100 mg via INTRAVENOUS

## 2014-10-30 MED ORDER — MEPERIDINE HCL 25 MG/ML IJ SOLN: 6.2500 mg | INTRAMUSCULAR | Status: DC | PRN

## 2014-10-30 MED ORDER — HYDROCODONE-ACETAMINOPHEN 5-325 MG PO TABS
ORAL_TABLET | ORAL | Status: AC
  Filled 2014-10-30: qty 1

## 2014-10-30 MED ORDER — LIDOCAINE HCL (CARDIAC) 20 MG/ML IV SOLN
INTRAVENOUS | Status: DC | PRN
  Administered 2014-10-30: 60 mg via INTRAVENOUS

## 2014-10-30 MED ORDER — HYDROMORPHONE HCL 1 MG/ML IJ SOLN
0.2500 mg | INTRAMUSCULAR | Status: DC | PRN
  Administered 2014-10-30 (×3): 0.5 mg via INTRAVENOUS

## 2014-10-30 MED ORDER — FENTANYL CITRATE (PF) 100 MCG/2ML IJ SOLN
INTRAMUSCULAR | Status: DC | PRN
  Administered 2014-10-30: 100 ug via INTRAVENOUS

## 2014-10-30 MED ORDER — PROMETHAZINE HCL 25 MG/ML IJ SOLN: 6.2500 mg | INTRAMUSCULAR | Status: DC | PRN

## 2014-10-30 MED ORDER — MORPHINE SULFATE 10 MG/ML IJ SOLN
INTRAMUSCULAR | Status: DC | PRN
  Administered 2014-10-30: 2 mg via INTRAVENOUS
  Administered 2014-10-30: 4 mg via INTRAVENOUS

## 2014-10-30 MED ORDER — MIDAZOLAM HCL 2 MG/2ML IJ SOLN: 0.5000 mg | Freq: Once | INTRAMUSCULAR | Status: DC | PRN

## 2014-10-30 MED ORDER — PROPOFOL 10 MG/ML IV BOLUS
INTRAVENOUS | Status: DC | PRN
  Administered 2014-10-30: 50 mg via INTRAVENOUS
  Administered 2014-10-30: 25 mg via INTRAVENOUS
  Administered 2014-10-30 (×2): 50 mg via INTRAVENOUS
  Administered 2014-10-30: 200 mg via INTRAVENOUS

## 2014-10-30 MED ORDER — LACTATED RINGERS IV SOLN
INTRAVENOUS | Status: DC | PRN
  Administered 2014-10-30 (×2): via INTRAVENOUS

## 2014-10-30 MED ORDER — FENTANYL CITRATE (PF) 100 MCG/2ML IJ SOLN
INTRAMUSCULAR | Status: AC
  Filled 2014-10-30: qty 4

## 2014-10-30 MED ORDER — HYDROCODONE-ACETAMINOPHEN 5-325 MG PO TABS
1.0000 | ORAL_TABLET | Freq: Once | ORAL | Status: AC | PRN
  Administered 2014-10-30: 1 via ORAL

## 2014-10-30 MED ORDER — BUPIVACAINE HCL (PF) 0.5 % IJ SOLN
INTRAMUSCULAR | Status: AC
  Filled 2014-10-30: qty 30

## 2014-10-30 MED ORDER — MIDAZOLAM HCL 2 MG/2ML IJ SOLN
INTRAMUSCULAR | Status: AC
  Filled 2014-10-30: qty 4

## 2014-10-30 MED ORDER — PROPOFOL 500 MG/50ML IV EMUL
INTRAVENOUS | Status: AC
  Filled 2014-10-30: qty 50

## 2014-10-30 MED ORDER — EPHEDRINE SULFATE 50 MG/ML IJ SOLN
INTRAMUSCULAR | Status: AC
  Filled 2014-10-30: qty 1

## 2014-10-30 MED ORDER — CEFAZOLIN SODIUM-DEXTROSE 2-3 GM-% IV SOLR: 2.0000 g | INTRAVENOUS | Status: DC

## 2014-10-30 SURGICAL SUPPLY — 84 items
APL SKNCLS STERI-STRIP NONHPOA (GAUZE/BANDAGES/DRESSINGS) ×2
BAG DECANTER FOR FLEXI CONT (MISCELLANEOUS) ×3 IMPLANT
BALL CTTN LRG ABS STRL LF (GAUZE/BANDAGES/DRESSINGS)
BENZOIN TINCTURE PRP APPL 2/3 (GAUZE/BANDAGES/DRESSINGS) ×6 IMPLANT
BINDER BREAST XLRG (GAUZE/BANDAGES/DRESSINGS) ×2 IMPLANT
BINDER BREAST XXLRG (GAUZE/BANDAGES/DRESSINGS) IMPLANT
BLADE HEX COATED 2.75 (ELECTRODE) IMPLANT
BLADE KNIFE  20 PERSONNA (BLADE) ×4
BLADE KNIFE 20 PERSONNA (BLADE) ×2 IMPLANT
BLADE KNIFE PERSONA 10 (BLADE) ×3 IMPLANT
BLADE KNIFE PERSONA 15 (BLADE) ×3 IMPLANT
CANISTER SUCT 1200ML W/VALVE (MISCELLANEOUS) ×3 IMPLANT
CLOSURE WOUND 1/2 X4 (GAUZE/BANDAGES/DRESSINGS)
COTTONBALL LRG STERILE PKG (GAUZE/BANDAGES/DRESSINGS) IMPLANT
COVER BACK TABLE 60X90IN (DRAPES) ×3 IMPLANT
COVER MAYO STAND STRL (DRAPES) ×3 IMPLANT
DECANTER SPIKE VIAL GLASS SM (MISCELLANEOUS) ×6 IMPLANT
DRAIN CHANNEL 10F 3/8 F FF (DRAIN) ×6 IMPLANT
DRAPE LAPAROSCOPIC ABDOMINAL (DRAPES) ×3 IMPLANT
DRAPE U-SHAPE 76X120 STRL (DRAPES) IMPLANT
DRAPE UTILITY XL STRL (DRAPES) ×3 IMPLANT
DRSG PAD ABDOMINAL 8X10 ST (GAUZE/BANDAGES/DRESSINGS) ×12 IMPLANT
ELECT REM PT RETURN 9FT ADLT (ELECTROSURGICAL) ×3
ELECTRODE REM PT RTRN 9FT ADLT (ELECTROSURGICAL) ×1 IMPLANT
EVACUATOR SILICONE 100CC (DRAIN) ×6 IMPLANT
GAUZE SPONGE 4X4 12PLY STRL (GAUZE/BANDAGES/DRESSINGS) ×6 IMPLANT
GAUZE XEROFORM 1X8 LF (GAUZE/BANDAGES/DRESSINGS) ×3 IMPLANT
GAUZE XEROFORM 5X9 LF (GAUZE/BANDAGES/DRESSINGS) ×6 IMPLANT
GLOVE BIO SURGEON STRL SZ 6.5 (GLOVE) ×2 IMPLANT
GLOVE BIO SURGEONS STRL SZ 6.5 (GLOVE) ×1
GLOVE BIOGEL M STRL SZ7.5 (GLOVE) ×3 IMPLANT
GLOVE BIOGEL PI IND STRL 8 (GLOVE) ×1 IMPLANT
GLOVE BIOGEL PI INDICATOR 8 (GLOVE) ×2
GLOVE ECLIPSE 7.0 STRL STRAW (GLOVE) ×3 IMPLANT
GOWN STRL REUS W/ TWL XL LVL3 (GOWN DISPOSABLE) ×2 IMPLANT
GOWN STRL REUS W/TWL XL LVL3 (GOWN DISPOSABLE) ×6
IV NS 500ML (IV SOLUTION) ×3
IV NS 500ML BAXH (IV SOLUTION) ×1 IMPLANT
NDL HYPO 25X1 1.5 SAFETY (NEEDLE) ×1 IMPLANT
NDL SPNL 18GX3.5 QUINCKE PK (NEEDLE) ×1 IMPLANT
NEEDLE HYPO 25X1 1.5 SAFETY (NEEDLE) ×3 IMPLANT
NEEDLE SPNL 18GX3.5 QUINCKE PK (NEEDLE) ×3 IMPLANT
NS IRRIG 1000ML POUR BTL (IV SOLUTION) IMPLANT
PACK BASIN DAY SURGERY FS (CUSTOM PROCEDURE TRAY) ×3 IMPLANT
PEN SKIN MARKING BROAD TIP (MISCELLANEOUS) ×3 IMPLANT
PENCIL BUTTON HOLSTER BLD 10FT (ELECTRODE) IMPLANT
PILLOW FOAM RUBBER ADULT (PILLOWS) ×3 IMPLANT
PIN SAFETY STERILE (MISCELLANEOUS) ×3 IMPLANT
SHEET MEDIUM DRAPE 40X70 STRL (DRAPES) ×3 IMPLANT
SHEETING SILICONE GEL EPI DERM (MISCELLANEOUS) ×2 IMPLANT
SLEEVE SCD COMPRESS KNEE MED (MISCELLANEOUS) ×3 IMPLANT
SPECIMEN JAR MEDIUM (MISCELLANEOUS) ×4 IMPLANT
SPECIMEN JAR X LARGE (MISCELLANEOUS) ×2 IMPLANT
SPONGE GAUZE 4X4 12PLY STER LF (GAUZE/BANDAGES/DRESSINGS) IMPLANT
SPONGE LAP 18X18 X RAY DECT (DISPOSABLE) ×12 IMPLANT
STAPLER VISISTAT 35W (STAPLE) IMPLANT
STOCKINETTE 4X48 STRL (DRAPES) IMPLANT
STRIP CLOSURE SKIN 1/2X4 (GAUZE/BANDAGES/DRESSINGS) IMPLANT
STRIP SUTURE WOUND CLOSURE 1/2 (SUTURE) ×15 IMPLANT
SUCTION FRAZIER TIP 10 FR DISP (SUCTIONS) IMPLANT
SUT ETHILON 3 0 PS 1 (SUTURE) IMPLANT
SUT MNCRL AB 3-0 PS2 18 (SUTURE) ×18 IMPLANT
SUT MON AB 2-0 CT1 36 (SUTURE) IMPLANT
SUT MON AB 5-0 PS2 18 (SUTURE) ×6 IMPLANT
SUT PROLENE 3 0 PS 2 (SUTURE) ×18 IMPLANT
SUT PROLENE 4 0 P 3 18 (SUTURE) IMPLANT
SUT PROLENE 4 0 PS 2 18 (SUTURE) IMPLANT
SYR 20CC LL (SYRINGE) IMPLANT
SYR 50ML LL SCALE MARK (SYRINGE) ×6 IMPLANT
SYR CONTROL 10ML LL (SYRINGE) ×3 IMPLANT
TAPE HYPAFIX 6 X30' (GAUZE/BANDAGES/DRESSINGS) ×1
TAPE HYPAFIX 6X30 (GAUZE/BANDAGES/DRESSINGS) ×2 IMPLANT
TAPE MEASURE 72IN RETRACT (INSTRUMENTS)
TAPE MEASURE LINEN 72IN RETRCT (INSTRUMENTS) IMPLANT
TAPE PAPER 1X10 WHT MICROPORE (GAUZE/BANDAGES/DRESSINGS) ×3 IMPLANT
TOWEL OR 17X24 6PK STRL BLUE (TOWEL DISPOSABLE) ×6 IMPLANT
TOWEL OR NON WOVEN STRL DISP B (DISPOSABLE) ×3 IMPLANT
TRAY DSU PREP LF (CUSTOM PROCEDURE TRAY) ×3 IMPLANT
TUBE CONNECTING 20'X1/4 (TUBING) ×1
TUBE CONNECTING 20X1/4 (TUBING) ×2 IMPLANT
TUBING SET GRADUATE ASPIR 12FT (MISCELLANEOUS) IMPLANT
UNDERPAD 30X30 (UNDERPADS AND DIAPERS) ×9 IMPLANT
VAC PENCILS W/TUBING CLEAR (MISCELLANEOUS) ×3 IMPLANT
YANKAUER SUCT BULB TIP NO VENT (SUCTIONS) ×3 IMPLANT

## 2014-10-30 NOTE — Anesthesia Postprocedure Evaluation (Signed)
  Anesthesia Post-op Note  Patient: Erin Forbes  Procedure(s) Performed: Procedure(s): BILATERAL EXCISION ACCESSORY BREAST TISSUE (Bilateral) BREAST REDUCTION WITH LIPOSUCTION (Bilateral)  Patient Location: PACU  Anesthesia Type:General  Level of Consciousness: awake  Airway and Oxygen Therapy: Patient Spontanous Breathing  Post-op Pain: mild  Post-op Assessment: Post-op Vital signs reviewed              Post-op Vital Signs: Reviewed  Last Vitals:  Filed Vitals:   10/30/14 1115  BP:   Pulse: 76  Temp:   Resp: 17    Complications: No apparent anesthesia complications

## 2014-10-30 NOTE — Discharge Instructions (Signed)
Breast Reduction Care After Refer to this sheet in the next few weeks. These instructions provide you with information on caring for yourself after your procedure. Your caregiver may also give you more specific instructions. Your treatment has been planned according to current medical practices, but problems sometimes occur. Call your caregiver if you have any problems or questions after your procedure. HOME CARE INSTRUCTIONS 1. Do not lift more than 5 pounds with one arm, or 10 pounds with both arms, for 1 month.  2. Do not sleep on your stomach for 4 to 6 weeks.  3. Do not do vigorous exercise such as bouncing, aerobics, or jumping for 6 weeks. Walking is not restricted.  4. Do not drive while you are taking prescription pain medicine.  5. Avoid prolonged sun exposure.  6. Keep dressings dry and clean 7. Measure jp drainage every 12 hrs and measure  8. You may slowly go back to your normal diet. Start with a light meal and increase as comfortable.  9. You may shower 24 hours after your drains are removed unless instructed differently by your caregiver.  10. Take your pain medicine as prescribed. Discomfort is normal after breast reduction surgery.  11. Keep the head of your bed elevated 40 degrees 12.  : Call the office if you notice:  You have a fever.   You notice drainage from the incision that smells bad.   You have persistent pain.   You have persistent bleeding from the incision or nipple discharge.   You develop increased swelling or swelling that is greater in one breast than in the other.  MAKE SURE YOU:  1. Understand these instructions.  2. Will watch your condition.  3. Will get help right away if you are not doing well or get worse.   Post Anesthesia Home Care Instructions  Activity: Get plenty of rest for the remainder of the day. A responsible adult should stay with you for 24 hours following the procedure.  For the next 24 hours, DO NOT: -Drive a  car -Paediatric nurse -Drink alcoholic beverages -Take any medication unless instructed by your physician -Make any legal decisions or sign important papers.  Meals: Start with liquid foods such as gelatin or soup. Progress to regular foods as tolerated. Avoid greasy, spicy, heavy foods. If nausea and/or vomiting occur, drink only clear liquids until the nausea and/or vomiting subsides. Call your physician if vomiting continues.  Special Instructions/Symptoms: Your throat may feel dry or sore from the anesthesia or the breathing tube placed in your throat during surgery. If this causes discomfort, gargle with warm salt water. The discomfort should disappear within 24 hours.  If you had a scopolamine patch placed behind your ear for the management of post- operative nausea and/or vomiting:  1. The medication in the patch is effective for 72 hours, after which it should be removed.  Wrap patch in a tissue and discard in the trash. Wash hands thoroughly with soap and water. 2. You may remove the patch earlier than 72 hours if you experience unpleasant side effects which may include dry mouth, dizziness or visual disturbances. 3. Avoid touching the patch. Wash your hands with soap and water after contact with the patch.About my Jackson-Pratt Bulb Drain  What is a Jackson-Pratt bulb? A Jackson-Pratt is a soft, round device used to collect drainage. It is connected to a long, thin drainage catheter, which is held in place by one or two small stiches near your surgical incision  site. When the bulb is squeezed, it forms a vacuum, forcing the drainage to empty into the bulb.  Emptying the Jackson-Pratt bulb- To empty the bulb: 1. Release the plug on the top of the bulb. 2. Pour the bulb's contents into a measuring container which your nurse will provide. 3. Record the time emptied and amount of drainage. Empty the drain(s) as often as your     doctor or nurse recommends.     Squeezing the  Jackson-Pratt Bulb- To squeeze the bulb: 1. Make sure the plug at the top of the bulb is open. 2. Squeeze the bulb tightly in your fist. You will hear air squeezing from the bulb. 3. Replace the plug while the bulb is squeezed. 4. Use a safety pin to attach the bulb to your clothing. This will keep the catheter from     pulling at the bulb insertion site.  When to call your doctor- Call your doctor if:  Drain site becomes red, swollen or hot.  You have a fever greater than 101 degrees F.  There is oozing at the drain site.  Drain falls out (apply a guaze bandage over the drain hole and secure it with tape).  Drainage increases daily not related to activity patterns. (You will usually have more drainage when you are active than when you are resting.)  Drainage has a bad odor.      JP Drain [employer] this sheet to all of your post-operative appointments while you have your drains.  Please measure your drains by CC's or ML's.  Make sure you drain and measure your JP Drains 2 or 3 times per day.  At the end of each day, add up totals for the left side and add up totals for the right side.    ( 9 am )     ( 3 pm )        ( 9 pm )                Date L  R  L  R  L  R  Total L/R

## 2014-10-30 NOTE — Transfer of Care (Signed)
Immediate Anesthesia Transfer of Care Note  Patient: Erin Forbes  Procedure(s) Performed: Procedure(s): BILATERAL EXCISION ACCESSORY BREAST TISSUE (Bilateral) BREAST REDUCTION WITH LIPOSUCTION (Bilateral)  Patient Location: PACU  Anesthesia Type:General  Level of Consciousness: awake and sedated  Airway & Oxygen Therapy: Patient Spontanous Breathing and Patient connected to face mask oxygen  Post-op Assessment: Report given to RN and Post -op Vital signs reviewed and stable  Post vital signs: Reviewed and stable  Last Vitals:  Filed Vitals:   10/30/14 1015  BP:   Pulse: 91  Temp:   Resp: 15    Complications: No apparent anesthesia complications

## 2014-10-30 NOTE — Anesthesia Preprocedure Evaluation (Signed)
Anesthesia Evaluation  Patient identified by MRN, date of birth, ID band Patient awake    Reviewed: Allergy & Precautions, NPO status , Patient's Chart, lab work & pertinent test results  History of Anesthesia Complications Negative for: history of anesthetic complications  Airway Mallampati: I  TM Distance: >3 FB Neck ROM: Full    Dental  (+) Dental Advisory Given   Pulmonary neg pulmonary ROS,    breath sounds clear to auscultation       Cardiovascular negative cardio ROS   Rhythm:Regular Rate:Normal     Neuro/Psych negative neurological ROS     GI/Hepatic Neg liver ROS, GERD  Controlled,  Endo/Other  Morbid obesity  Renal/GU negative Renal ROS     Musculoskeletal   Abdominal (+) + obese,   Peds  Hematology   Anesthesia Other Findings   Reproductive/Obstetrics LMP 1 week ago                             Anesthesia Physical Anesthesia Plan  ASA: II  Anesthesia Plan: General   Post-op Pain Management:    Induction: Intravenous  Airway Management Planned: Oral ETT  Additional Equipment:   Intra-op Plan:   Post-operative Plan: Extubation in OR  Informed Consent: I have reviewed the patients History and Physical, chart, labs and discussed the procedure including the risks, benefits and alternatives for the proposed anesthesia with the patient or authorized representative who has indicated his/her understanding and acceptance.   Dental advisory given  Plan Discussed with: CRNA and Surgeon  Anesthesia Plan Comments: (Plan routine monitors, GETA)        Anesthesia Quick Evaluation

## 2014-10-30 NOTE — Brief Op Note (Signed)
10/30/2014  10:13 AM  PATIENT:  Erin Forbes  44 y.o. female  PRE-OPERATIVE DIAGNOSIS:  MACROMASTIA  POST-OPERATIVE DIAGNOSIS:  MACROMASTIA  PROCEDURE:  Procedure(s): BILATERAL EXCISION ACCESSORY BREAST TISSUE (Bilateral) BREAST REDUCTION WITH LIPOSUCTION (Bilateral)  SURGEON:  Surgeon(s) and Role:    * [name], MD - Primary  PHYSICIAN ASSISTANT:   ASSISTANTS: none   ANESTHESIA:   general  EBL:  Total I/O In: 3100 [I.V.:3100] Out: -   BLOOD ADMINISTERED:none  DRAINS: (VV) Jackson-Pratt drain(s) with closed bulb suction in the RIGHT AND LEFT LATERAL CHEST AREAS   LOCAL MEDICATIONS USED:  LIDOCAINE   SPECIMEN:  Excision  DISPOSITION OF SPECIMEN:  PATHOLOGY  COUNTS:  YES  TOURNIQUET:  * No tourniquets in log *  DICTATION: .Other Dictation: Dictation Number [id number]  PLAN OF CARE: Discharge to home after PACU  PATIENT DISPOSITION:  PACU - hemodynamically stable.   Delay start of Pharmacological VTE agent (>24hrs) due to surgical blood loss or risk of bleeding: yes

## 2014-10-30 NOTE — Anesthesia Procedure Notes (Signed)
Procedure Name: Intubation Performed by: Destine Ambroise W Pre-anesthesia Checklist: Patient identified, Timeout performed, Emergency Drugs available, Suction available and Patient being monitored Patient Re-evaluated:Patient Re-evaluated prior to inductionOxygen Delivery Method: Circle system utilized Preoxygenation: Pre-oxygenation with 100% oxygen Intubation Type: IV induction Ventilation: Mask ventilation without difficulty Laryngoscope Size: Miller and 2 Grade View: Grade I Tube type: Oral Tube size: 7.0 mm Number of attempts: 1 Airway Equipment and Method: Stylet Placement Confirmation: ETT inserted through vocal cords under direct vision,  breath sounds checked- equal and bilateral and positive ETCO2 Secured at: 22 cm Tube secured with: Tape Dental Injury: Teeth and Oropharynx as per pre-operative assessment      

## 2014-10-31 ENCOUNTER — Encounter (HOSPITAL_BASED_OUTPATIENT_CLINIC_OR_DEPARTMENT_OTHER): Payer: Self-pay | Admitting: Specialist

## 2014-10-31 NOTE — Op Note (Signed)
NAMECRESSIDA, MILFORD NO.:  [id number]  MEDICAL RECORD NO.:  [id number]  LOCATION:                               FACILITY:  [facility]  PHYSICIAN:  Berneta Sages L. Brazen Domangue, M.D.DATE OF BIRTH:  [birth year]  DATE OF PROCEDURE:  10/30/2014 DATE OF DISCHARGE:  10/30/2014                              OPERATIVE REPORT   A 44 year old lady with severe, severe macromastia, back and shoulder pain secondary to large pendulous breasts, right side greater than the left.  The patient also has increased accessory breast tissue that is going to be removed as well.  All procedures in detail were explained to the patient preoperatively.  All questions were answered as well as the patient understands the risks and possible complications. Preoperatively, the patient sat up and drawn for the reduction mammoplasty.  We marked the nipple-areolar complex from over 33 cm to 22 cm.  She then underwent general anesthesia, intubated orally.  Prep was done to the chest, breast areas in routine fashion using Hibiclens soap and solution, walled off with sterile towels and drapes, so as to make a sterile field.  Tumescent solution was injected in the right and left breast areas for hemostasis and was allowed to sit for approximately 5 minutes.  Then, the wounds were scored with #15 blade.  The skin of the inferior pedicles de-epithelialized with #20 blade.  Medial and lateral fatty dermal pedicles were incised down to underlying fascia. Hemostasis maintained with Bovie coagulation.  After this, the accessory breast tissue was removed with sharp dissection as well as liposuction assistance in the right and left lateral and axillary regions of the breast and chest.  New keyhole area was also debulked and after proper hemostasis, the flaps were transposed and stayed with 3-0 Prolene. Subcutaneous closure was done with 3-0 Monocryl x2 levels and then running subcuticular stitch of 3-0 Monocryl throughout  the inverted T. The wounds were drained with #10 fully fluted Blake drains which were placed in depths of wound and brought out through the lateral portion of the incision and secured with 3-0 Prolene.  Same procedure was carried out on both sides, removing over 600 g on the left side and 880 g on the right.  Nipple-areolar complex was examined at the end of the procedure, showing excellent blood supply, soft, fullness.  Dressings were applied, 4x4s, ABDs, Hypafix tape, and silicone gel patches, and taped appropriately.  She was then taken to recovery in excellent condition.     Odella Aquas. Towanda Malkin, M.D.     Elie Confer  D:  10/30/2014  T:  10/31/2014  Job:  [id number]

## 2014-12-26 ENCOUNTER — Ambulatory Visit (INDEPENDENT_AMBULATORY_CARE_PROVIDER_SITE_OTHER): Payer: BLUE CROSS/BLUE SHIELD | Admitting: Family Medicine

## 2014-12-26 ENCOUNTER — Encounter: Payer: Self-pay | Admitting: Family Medicine

## 2014-12-26 VITALS — BP 136/86 | HR 82 | Resp 16 | Ht 66.0 in | Wt 205.0 lb

## 2014-12-26 DIAGNOSIS — Z13220 Encounter for screening for lipoid disorders: Secondary | ICD-10-CM

## 2014-12-26 DIAGNOSIS — R7303 Prediabetes: Secondary | ICD-10-CM

## 2014-12-26 DIAGNOSIS — E559 Vitamin D deficiency, unspecified: Secondary | ICD-10-CM

## 2014-12-26 DIAGNOSIS — E669 Obesity, unspecified: Secondary | ICD-10-CM

## 2014-12-26 DIAGNOSIS — M546 Pain in thoracic spine: Secondary | ICD-10-CM

## 2014-12-26 DIAGNOSIS — Z1159 Encounter for screening for other viral diseases: Secondary | ICD-10-CM | POA: Diagnosis not present

## 2014-12-26 DIAGNOSIS — D509 Iron deficiency anemia, unspecified: Secondary | ICD-10-CM

## 2014-12-26 DIAGNOSIS — N62 Hypertrophy of breast: Secondary | ICD-10-CM

## 2014-12-26 DIAGNOSIS — J302 Other seasonal allergic rhinitis: Secondary | ICD-10-CM | POA: Diagnosis not present

## 2014-12-26 MED ORDER — ERGOCALCIFEROL 1.25 MG (50000 UT) PO CAPS: 50000.0000 [IU] | ORAL_CAPSULE | ORAL | Status: DC

## 2014-12-26 MED ORDER — PREDNISONE 5 MG PO TABS: 5.0000 mg | ORAL_TABLET | Freq: Two times a day (BID) | ORAL | Status: AC

## 2014-12-26 NOTE — Assessment & Plan Note (Addendum)
Uncontrolled with frontal pressure, pred sent for 5 days, no symptom or sign of infection Encouraged regular use of OTC allergy med and saline nasal flushes

## 2014-12-26 NOTE — Patient Instructions (Addendum)
CPE in 4 month, call if you need me sooner  Thankful breast surgery has been successful, I will follow up on the report as we discussed at visit and get back in touch  Work on healthy food choice and eating habits to improve your health  Prednisone sent for5 days to help with allergy symptoms and headache  You need to take weekly vitamin D , this is sent in   Fasting lipid, cmp, hBA1C, vit D, TSH, HIV and cBC in 4 month

## 2014-12-26 NOTE — Progress Notes (Signed)
Subjective:    Patient ID: Celene Kras, female    DOB: [birth year], 44 y.o.   MRN: [id number]  HPI   TERAH GIAIMO     MRN: [id number]      DOB: 1970-02-03   HPI Ms. Weberg is here for follow up and re-evaluation of chronic medical conditions, medication management and review of any available recent lab and radiology data.  Preventive health is updated, specifically  Cancer screening and Immunization.   Has had successful brast reduction surgery , but due to abnormal pathology should have evaluation by oncology will look furhter into this and contact both local oncologist and plastic surgeon. The PT denies any adverse reactions to current medications since the last visit.  C/o increased and uncontrolled allergy symptoms in past several weeks with clear nasal drainage , no fever or chills, no sore throat or ear pain   ROS Denies recent fever or chills.  Denies chest congestion, productive cough or wheezing. Denies chest pains, palpitations and leg swelling Denies abdominal pain, nausea, vomiting,diarrhea or constipation.   Denies dysuria, frequency, hesitancy or incontinence. Denies joint pain, swelling and limitation in mobility. Denies headaches, seizures, numbness, or tingling. Denies depression, anxiety or insomnia. Denies skin break down or rash.   PE  BP 136/86 mmHg  Pulse 82  Resp 16  Ht 5\' 6"  (1.676 m)  Wt 205 lb (92.987 kg)  BMI 33.10 kg/m2  SpO2 98%  Patient alert and oriented and in no cardiopulmonary distress.  HEENT: No facial asymmetry, EOMI,   oropharynx pink and moist.  Neck supple no JVD, no mass.Nasal mucosa erythematous and edematous  Chest: Clear to auscultation bilaterally.  CVS: S1, S2 no murmurs, no S3.Regular rate.  ABD: Soft non tender.   Ext: No edema  MS: Adequate ROM spine, shoulders, hips and knees.  Skin: Intact, no ulcerations or rash noted.  Psych: Good eye contact, normal affect. Memory intact not anxious or depressed  appearing.  CNS: CN 2-12 intact, power,  normal throughout.no focal deficits noted.   Assessment & Plan  Seasonal allergies Uncontrolled with frontal pressure, pred sent for 5 days, no symptom or sign of infection Encouraged regular use of OTC allergy med and saline nasal flushes  Macromastia Successful breast reduction surgery in 2016, however abnormal cells reported , referred to oncology  Obese Deteriorated. Patient re-educated about  the importance of commitment to a  minimum of 150 minutes of exercise per week.  The importance of healthy food choices with portion control discussed. Encouraged to start a food diary, count calories and to consider  joining a support group. Sample diet sheets offered. Goals set by the patient for the next several months.   Weight /BMI 01/05/2015 12/26/2014 10/30/2014  WEIGHT 206 lb 205 lb -  HEIGHT 5' 5.5" 5\' 6"  -  BMI 33.75 kg/m2 33.1 kg/m2 33.43 kg/m2    Current exercise per week 60 minutes.   Prediabetes Patient educated about the importance of limiting  Carbohydrate intake , the need to commit to daily physical activity for a minimum of 30 minutes , and to commit weight loss. The fact that changes in all these areas will reduce or eliminate all together the development of diabetes is stressed.  Updated lab needed at/ before next visit.   Diabetic Labs Latest Ref Rng 08/23/2014 01/04/2014 02/24/2013 02/25/2012 07/18/2011  HbA1c <5.7 % 6.1(H) 6.2(H) 5.9(H) 5.8(H) -  Chol 125 - 200 mg/dL 114(L) - 103 109 -  HDL >=46 mg/dL 40(L) -  42 42 -  Calc LDL <130 mg/dL 41 - 48 56 -  Triglycerides <150 mg/dL 163(H) - 67 53 -  Creatinine 0.50 - 1.10 mg/dL 0.73 - 0.72 0.77 0.71   BP/Weight 01/05/2015 12/26/2014 10/30/2014 10/20/2014 08/23/2014 07/21/2014 [id number]  Systolic BP - [id number] - [id number]  Diastolic BP - 86 83 - 84 65 82  Wt. (Lbs) 206 205 - 207 207 198 200  BMI 33.75 33.1 33.43 - 33.43 32.95 32.3   No flowsheet data found.     Vitamin D  deficiency Need to commit to weekly supplement is discussed  Thoracic back pain Improved following breast reduction       Review of Systems     Objective:   Physical Exam        Assessment & Plan:

## 2014-12-27 ENCOUNTER — Telehealth: Payer: Self-pay | Admitting: Family Medicine

## 2014-12-27 ENCOUNTER — Other Ambulatory Visit: Payer: Self-pay | Admitting: Family Medicine

## 2014-12-27 DIAGNOSIS — N6091 Unspecified benign mammary dysplasia of right breast: Secondary | ICD-10-CM

## 2014-12-27 NOTE — Telephone Encounter (Signed)
Erin Forbes please lett pt know that I contacted the cancer Doc here, she recommends follow up with her to review pathology of right breast and direct necessary follow up. After you explain that to her she may be given the appt info  ( Don't want her to here appt at cancer ctr prior to explanation tho I discussed at visit)  ??? pls ask

## 2014-12-29 NOTE — Telephone Encounter (Signed)
Patient aware and given information for cancer center on 12/27 @ 2pm .  Advised to arrive 15 minutes early in case she needs to fill out forms.

## 2015-01-05 ENCOUNTER — Emergency Department (HOSPITAL_BASED_OUTPATIENT_CLINIC_OR_DEPARTMENT_OTHER)
Admission: EM | Admit: 2015-01-05 | Discharge: 2015-01-05 | Disposition: A | Discharge status: Home / Self Care | Payer: BLUE CROSS/BLUE SHIELD | Attending: Emergency Medicine | Admitting: Emergency Medicine

## 2015-01-05 ENCOUNTER — Encounter (HOSPITAL_BASED_OUTPATIENT_CLINIC_OR_DEPARTMENT_OTHER): Payer: Self-pay | Admitting: Emergency Medicine

## 2015-01-05 DIAGNOSIS — Z7951 Long term (current) use of inhaled steroids: Secondary | ICD-10-CM | POA: Diagnosis not present

## 2015-01-05 DIAGNOSIS — L02412 Cutaneous abscess of left axilla: Secondary | ICD-10-CM | POA: Diagnosis not present

## 2015-01-05 DIAGNOSIS — M79602 Pain in left arm: Secondary | ICD-10-CM | POA: Diagnosis present

## 2015-01-05 MED ORDER — HYDROMORPHONE HCL 1 MG/ML IJ SOLN
2.0000 mg | Freq: Once | INTRAMUSCULAR | Status: AC
  Administered 2015-01-05: 2 mg via INTRAMUSCULAR
  Filled 2015-01-05: qty 2

## 2015-01-05 MED ORDER — LIDOCAINE-EPINEPHRINE 2 %-1:100000 IJ SOLN
20.0000 mL | Freq: Once | INTRAMUSCULAR | Status: AC
  Administered 2015-01-05: 20 mL
  Filled 2015-01-05: qty 1

## 2015-01-05 MED ORDER — HYDROCODONE-ACETAMINOPHEN 5-325 MG PO TABS: 1.0000 | ORAL_TABLET | Freq: Four times a day (QID) | ORAL | Status: DC | PRN

## 2015-01-05 NOTE — ED Notes (Signed)
Abscess in left armpit since yesterday.

## 2015-01-05 NOTE — Discharge Instructions (Signed)
Incision and Drainage Incision and drainage is a procedure in which a sac-like structure (cystic structure) is opened and drained. The area to be drained usually contains material such as pus, fluid, or blood.  LET YOUR CAREGIVER KNOW ABOUT:   Allergies to medicine.  Medicines taken, including vitamins, herbs, eyedrops, over-the-counter medicines, and creams.  Use of steroids (by mouth or creams).  Previous problems with anesthetics or numbing medicines.  History of bleeding problems or blood clots.  Previous surgery.  Other health problems, including diabetes and kidney problems.  Possibility of pregnancy, if this applies. RISKS AND COMPLICATIONS  Pain.  Bleeding.  Scarring.  Infection. BEFORE THE PROCEDURE  You may need to have an ultrasound or other imaging tests to see how large or deep your cystic structure is. Blood tests may also be used to determine if you have an infection or how severe the infection is. You may need to have a tetanus shot. PROCEDURE  The affected area is cleaned with a cleaning fluid. The cyst area will then be numbed with a medicine (local anesthetic). A small incision will be made in the cystic structure. A syringe or catheter may be used to drain the contents of the cystic structure, or the contents may be squeezed out. The area will then be flushed with a cleansing solution. After cleansing the area, it is often gently packed with a gauze or another wound dressing. Once it is packed, it will be covered with gauze and tape or some other type of wound dressing. AFTER THE PROCEDURE   Often, you will be allowed to go home right after the procedure.  You may be given antibiotic medicine to prevent or heal an infection.  If the area was packed with gauze or some other wound dressing, you will likely need to come back in 1 to 2 days to get it removed.  The area should heal in about 14 days.   This information is not intended to replace advice given  to you by your health care provider. Make sure you discuss any questions you have with your health care provider.   Document Released: 07/02/2000 Document Revised: 07/08/2011 Document Reviewed: 03/03/2011 Elsevier Interactive Patient Education 2016 [employer]  

## 2015-01-05 NOTE — ED Provider Notes (Signed)
CSN: [id number]     Arrival date & time 01/05/15  0051 History   First MD Initiated Contact with Patient 01/05/15 0112     Chief Complaint  Patient presents with  . Abscess     (Consider location/radiation/quality/duration/timing/severity/associated sxs/prior Treatment) HPI  This is a 44 year old female with a tender, pointing abscess of her left axilla that has developed over about the last 24 hours. There is moderate to severe associated pain, worse with palpation or movement. She denies systemic symptoms such as fever or chills. She has no history of similar abscesses in the past.  Past Medical History  Diagnosis Date  . Headache 2000   Past Surgical History  Procedure Laterality Date  . Cholecystectomy    . Cesarean section  1995, 2002    two  . Mass excision Bilateral 10/30/2014    Procedure: BILATERAL EXCISION ACCESSORY BREAST TISSUE;  Surgeon: [name], MD;  Location: [facility];  Service: Plastics;  Laterality: Bilateral;  . Breast reduction surgery Bilateral 10/30/2014    Procedure: BREAST REDUCTION WITH LIPOSUCTION;  Surgeon: [name], MD;  Location: [facility];  Service: Plastics;  Laterality: Bilateral;   Family History  Problem Relation Age of Onset  . Stroke Mother 27  . Hypertension Mother   . Hypertension Father   . Hypertension Sister   . Hypertension Brother    Social History  Substance Use Topics  . Smoking status: Never Smoker   . Smokeless tobacco: Never Used  . Alcohol Use: Yes   OB History    No data available     Review of Systems  All other systems reviewed and are negative.   Allergies  Prednisone  Home Medications   Prior to Admission medications   Medication Sig Start Date End Date Taking? Authorizing Provider  ergocalciferol (VITAMIN D2) 50000 UNITS capsule Take 1 capsule (50,000 Units total) by mouth once a week. One capsule once weekly 12/26/14   Fayrene Helper, MD  fluticasone  [facility]) 50 MCG/ACT nasal spray Place 2 sprays into both nostrils daily. 03/30/14   Fayrene Helper, MD   Pulse 94  Temp(Src) 98.2 F (36.8 C) (Oral)  Resp 20  Ht 5' 5.5" (1.664 m)  Wt 206 lb (93.441 kg)  BMI 33.75 kg/m2  SpO2 100%  LMP 01/05/2015   Physical Exam  General: Well-developed, well-nourished female in no acute distress; appearance consistent with age of record HENT: normocephalic; atraumatic Eyes: pupils equal, round and reactive to light; extraocular muscles intact Neck: supple Heart: regular rate and rhythm Lungs: clear to auscultation bilaterally Abdomen: soft; nondistended; nontender Extremities: No deformity; full range of motion Neurologic: Awake, alert and oriented; motor function intact in all extremities and symmetric; no facial droop Skin: Warm and dry; pointing abscess of left axilla:    Psychiatric: Normal mood and affect    ED Course  Procedures (including critical care time)  INCISION AND DRAINAGE Performed by: Wynetta Fines Consent: Verbal consent obtained. Risks and benefits: risks, benefits and alternatives were discussed Type: abscess  Body area: Left axilla  Anesthesia: local infiltration  Incision was made with a scalpel.  Local anesthetic: lidocaine 2 % with epinephrine  Anesthetic total: 2 ml  Complexity: complex Blunt dissection to break up loculations  Drainage: purulent  Drainage amount: Copious   Packing material: 1/4 in iodoform gauze  Patient tolerance: Patient tolerated the procedure well with no immediate complications.     MDM      Shanon Rosser, MD  01/05/15 0129 

## 2015-01-16 ENCOUNTER — Ambulatory Visit (HOSPITAL_COMMUNITY): Payer: BLUE CROSS/BLUE SHIELD | Admitting: Hematology & Oncology

## 2015-01-22 NOTE — Assessment & Plan Note (Signed)
Successful breast reduction surgery in 2016, however abnormal cells reported , referred to oncology

## 2015-01-22 NOTE — Assessment & Plan Note (Signed)
Need to commit to weekly supplement is discussed

## 2015-01-22 NOTE — Assessment & Plan Note (Signed)
Deteriorated. Patient re-educated about  the importance of commitment to a  minimum of 150 minutes of exercise per week.  The importance of healthy food choices with portion control discussed. Encouraged to start a food diary, count calories and to consider  joining a support group. Sample diet sheets offered. Goals set by the patient for the next several months.   Weight /BMI 01/05/2015 12/26/2014 10/30/2014  WEIGHT 206 lb 205 lb -  HEIGHT 5' 5.5" 5\' 6"  -  BMI 33.75 kg/m2 33.1 kg/m2 33.43 kg/m2    Current exercise per week 60 minutes.

## 2015-01-22 NOTE — Assessment & Plan Note (Signed)
Patient educated about the importance of limiting  Carbohydrate intake , the need to commit to daily physical activity for a minimum of 30 minutes , and to commit weight loss. The fact that changes in all these areas will reduce or eliminate all together the development of diabetes is stressed.  Updated lab needed at/ before next visit.   Diabetic Labs Latest Ref Rng 08/23/2014 01/04/2014 02/24/2013 02/25/2012 07/18/2011  HbA1c <5.7 % 6.1(H) 6.2(H) 5.9(H) 5.8(H) -  Chol 125 - 200 mg/dL 114(L) - 103 109 -  HDL >=46 mg/dL 40(L) - 42 42 -  Calc LDL <130 mg/dL 41 - 48 56 -  Triglycerides <150 mg/dL 163(H) - 67 53 -  Creatinine 0.50 - 1.10 mg/dL 0.73 - 0.72 0.77 0.71   BP/Weight 01/05/2015 12/26/2014 10/30/2014 10/20/2014 08/23/2014 07/21/2014 [id number]  Systolic BP - [id number] - [id number]  Diastolic BP - 86 83 - 84 65 82  Wt. (Lbs) 206 205 - 207 207 198 200  BMI 33.75 33.1 33.43 - 33.43 32.95 32.3   No flowsheet data found.

## 2015-01-22 NOTE — Assessment & Plan Note (Signed)
Improved following breast reduction

## 2015-01-29 ENCOUNTER — Encounter (HOSPITAL_COMMUNITY): Payer: BLUE CROSS/BLUE SHIELD | Attending: Hematology & Oncology | Admitting: Hematology & Oncology

## 2015-01-29 ENCOUNTER — Encounter (HOSPITAL_COMMUNITY): Payer: Self-pay | Admitting: Hematology & Oncology

## 2015-01-29 VITALS — BP 134/71 | HR 77 | Temp 98.5°F | Resp 18 | Ht 66.0 in | Wt 206.0 lb

## 2015-01-29 DIAGNOSIS — N62 Hypertrophy of breast: Secondary | ICD-10-CM | POA: Diagnosis not present

## 2015-01-29 DIAGNOSIS — N6091 Unspecified benign mammary dysplasia of right breast: Secondary | ICD-10-CM | POA: Insufficient documentation

## 2015-01-29 NOTE — Patient Instructions (Addendum)
[place] [facility] Discharge Instructions  RECOMMENDATIONS MADE BY THE CONSULTANT AND ANY TEST RESULTS WILL BE SENT TO YOUR REFERRING PHYSICIAN.    Exam completed by Dr Whitney Muse today. Atypical lobular hyperplasia (benign breast change). This was only in your right breast but it can still affect both breasts. Atypical means not normal. Lobular is where the milk is made. Hyperplasia is just an overgrowth. Get use to your new normal from your breast reduction and  Do breast self exams.  It is critically important for you to do your mammogram every year. Make sure you are taking your Vitamin D, it is important. Any kind of activity even walking will help greatly.  With the model we used there is no benefit for you to take tamoxifen. Need to get a mammogram, clear it first with your plastic surgeon to make sure its ok to have your mammogram completed. Please call the clinic if you have any questions or concerns.     Thank you for choosing [place] at [facility] to provide your oncology and hematology care.  To afford each patient quality time with our provider, please arrive at least 15 minutes before your scheduled appointment time.    You need to re-schedule your appointment should you arrive 10 or more minutes late.  We strive to give you quality time with our providers, and arriving late affects you and other patients whose appointments are after yours.  Also, if you no show three or more times for appointments you may be dismissed from the clinic at the providers discretion.     Again, thank you for choosing [facility].  Our hope is that these requests will decrease the amount of time that you wait before being seen by our physicians.       _____________________________________________________________  Should you have questions after your visit to [facility], please contact our office at [phone]  between the hours of 8:30 a.m. and 4:30 p.m.  Voicemails left after 4:30 p.m. will not be returned until the following business day.  For prescription refill requests, have your pharmacy contact our office.

## 2015-01-29 NOTE — Progress Notes (Signed)
[facility] NOTE  Patient Care Team: Fayrene Helper, MD as PCP - General  CHIEF COMPLAINTS/PURPOSE OF CONSULTATION:  Atypical Lobular Hyperplasia  HISTORY OF PRESENTING ILLNESS:  Erin Forbes 45 y.o. 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 She had a breast reduction secondary to chronic back pain and shoulder pain. No problems after surgery but notes a recent absess under the L axillae, and was seen in the ED on 12/16.  The patient saw Dr. Moshe Cipro prior to follow-up with her plastic surgeon.  She notes that Dr. Moshe Cipro mentioned that she had some concerning pathology after her reduction and recommended she come speak with an oncologist. She is here today for additional consultation.   Breast fed children for first 3 months after birth. 45 years old at first live birth, 45 year old with birth of second child. Started menstrual cycle at age 35 or 32. Still has regular menses now.   Has had mammograms. Has known fibrocystic breast disease. Goes to [facility]. Has a history of one abnormal mammogram but no biopsy history. No significant family history of breast cancer except in her paternal grandmother. She did not die from her disease.    MEDICAL HISTORY:  Past Medical History  Diagnosis Date  . Headache 2000    SURGICAL HISTORY: Past Surgical History  Procedure Laterality Date  . Cholecystectomy    . Cesarean section  1995, 2002    two  . Mass excision Bilateral 10/30/2014    Procedure: BILATERAL EXCISION ACCESSORY BREAST TISSUE;  Surgeon: [name], MD;  Location: [place];  Service: Plastics;  Laterality: Bilateral;  . Breast reduction surgery Bilateral 10/30/2014    Procedure: BREAST REDUCTION WITH LIPOSUCTION;  Surgeon: [name], MD;  Location: [place];  Service: Plastics;  Laterality: Bilateral;    SOCIAL HISTORY: Social History   Social History  .  Marital Status: Single    Spouse Name: N/A  . Number of Children: N/A  . Years of Education: N/A   Occupational History  . Not on file.   Social History Main Topics  . Smoking status: Never Smoker   . Smokeless tobacco: Never Used  . Alcohol Use: Yes  . Drug Use: No  . Sexual Activity: Not Currently    Birth Control/ Protection: None   Other Topics Concern  . Not on file   Social History Narrative  Single, 2 children aged 61 and 28. No grandchildren. Non smoker. Occasional ETOH. Born in [place]. Erin Forbes. Works for [employer]. LIkes to [employer].   FAMILY HISTORY: Family History  Problem Relation Age of Onset  . Stroke Mother 64  . Hypertension Mother   . Hypertension Father   . Hypertension Sister   . Hypertension Brother    Mother is deceased from an aneurysm at age 47. Father is living at age 51. He has HTN. 3 sisters, 1 brother. All relatively healthy, HTN.  Father's mother had breast cancer, had a mastectomy.  She did not die from breast cancer.   ALLERGIES:  is allergic to prednisone.  MEDICATIONS:  Current Outpatient Prescriptions  Medication Sig Dispense Refill  . ergocalciferol (VITAMIN D2) 50000 UNITS capsule Take 1 capsule (50,000 Units total) by mouth once a week. One capsule once weekly 12 capsule 1  . fluticasone (FLONASE) 50 MCG/ACT nasal spray Place 2 sprays into both nostrils daily. 16 g 6  . HYDROcodone-acetaminophen (NORCO) 5-325 MG tablet Take 1-2  tablets by mouth every 6 (six) hours as needed (for pain). 10 tablet 0   No current facility-administered medications for this visit.    REVIEW OF SYSTEMS:   Constitutional: Denies fevers, chills or abnormal night sweats Eyes: Denies blurriness of vision, double vision or watery eyes Ears, nose, mouth, throat, and face: Denies mucositis or sore throat Respiratory: Denies cough, dyspnea or wheezes Cardiovascular: Denies palpitation, chest discomfort or lower extremity swelling Gastrointestinal:  Denies  nausea, heartburn or change in bowel habits Skin: Denies abnormal skin rashes Lymphatics: Denies new lymphadenopathy or easy bruising Neurological:Denies numbness, tingling or new weaknesses Behavioral/Psych: Mood is stable, no new changes  All other systems were reviewed with the patient and are negative.  PHYSICAL EXAMINATION: ECOG PERFORMANCE STATUS: 0 - Asymptomatic  Filed Vitals:   01/29/15 1417  BP: 134/71  Pulse: 77  Temp: 98.5 F (36.9 C)  Resp: 18   Filed Weights   01/29/15 1417  Weight: 206 lb (93.441 kg)    GENERAL:alert, no distress and comfortable SKIN: skin color, texture, turgor are normal, no rashes or significant lesions EYES: normal, conjunctiva are pink and non-injected, sclera clear OROPHARYNX:no exudate, no erythema and lips, buccal mucosa, and tongue normal  NECK: supple, thyroid normal size, non-tender, without nodularity LYMPH:  no palpable lymphadenopathy in the cervical, axillary or inguinal LUNGS: clear to auscultation and percussion with normal breathing effort HEART: regular rate & rhythm and no murmurs and no lower extremity edema ABDOMEN:abdomen soft, non-tender and normal bowel sounds Musculoskeletal:no cyanosis of digits and no clubbing  PSYCH: alert & oriented x 3 with fluent speech NEURO: no focal motor/sensory deficits  LABORATORY DATA:  I have reviewed the data as listed Lab Results  Component Value Date   WBC 9.1 08/23/2014   HGB 10.3* 10/30/2014   HCT 35.4* 08/23/2014   MCV 75.8* 08/23/2014   PLT 338 08/23/2014     Chemistry      Component Value Date/Time   NA 140 08/23/2014 1720   K 3.8 08/23/2014 1720   CL 103 08/23/2014 1720   CO2 26 08/23/2014 1720   BUN 7 08/23/2014 1720   CREATININE 0.73 08/23/2014 1720   CREATININE 0.71 07/18/2011 1207      Component Value Date/Time   CALCIUM 9.6 08/23/2014 1720   ALKPHOS 85 02/25/2012 0938   AST 15 02/25/2012 0938   ALT 12 02/25/2012 0938   BILITOT 0.4 02/25/2012 0938       RADIOGRAPHIC STUDIES: I have personally reviewed the radiological images as listed and agreed with the findings in the report. CLINICAL DATA: 45 year old female complaining of a palpable abnormality in the right breast.  EXAM: DIGITAL DIAGNOSTIC BILATERAL MAMMOGRAM WITH CAD  ULTRASOUND RIGHT BREAST  COMPARISON: With priors.  ACR Breast Density Category c: The breast tissue is heterogeneously dense, which may obscure small masses.  FINDINGS: There is a 2.1 cm obscured mass in the upper outer quadrant of the right breast corresponding with the patient's palpable abnormality. No additional masses are seen in the right breast. The left breast is negative. There are no malignant type microcalcifications.  Mammographic images were processed with CAD.  On physical exam, I palpate a discrete mass in the right breast at 1 o'clock 6 cm from the nipple.  Ultrasound is performed, showing an anechoic cyst with some internal debris in the right breast at 1 o'clock 6 cm from the nipple measuring 1.8 x 1.1 x 2.0 cm. There is no solid mass or abnormal shadowing.  IMPRESSION: Right  breast cyst. No evidence of malignancy.  RECOMMENDATION: Bilateral screening mammogram in 1 year is recommended.  I have discussed the findings and recommendations with the patient. Results were also provided in writing at the conclusion of the visit. If applicable, a reminder letter will be sent to the patient regarding the next appointment.  BI-RADS CATEGORY 2: Benign.   Electronically Signed  By: Lillia Mountain M.D.  On: 01/18/2014 08:27   ASSESSMENT & PLAN:  Atypical Lobular Hyperplasia Pre-menopausal  We discussed that Atypical hyperplasias,confer a substantial increase in the risk of subsequent breast cancer (relative risk 3.7 to 5.3). AH is associated with a generalized increased risk of both ipsilateral and contralateral breast cancer , although it is higher in the  ipsilateral breast.I emphasized to the patient that [facility] is not cancer nor is it a precancerous lesion.  In a report from the Nurses' Health Study, the cumulative incidence of breast cancer over 30 years approached 35 percent. Invasive carcinoma was approximately three times more likely to occur in the ipsilateral breast after [facility] was diagnosed compared with the contralateral breast.  The options for the management of AH include surveillance and chemoprevention with a selective estrogen receptor modulator (SERM) specifically tamoxifen since she is premenopausal. There are insufficient data to support annual screening with MRI for women who are of an elevated risk from a biopsy revealing atypia. While MRI is more sensitive than mammography, specificity is limited, especially for younger women. A large number of unnecessary biopsies will be generated while finding cancer in only a small group of patients.  We reviewed her data using the [facility] Risk assessment model and opted to not pursue endocrine therapy.  She was given reading information regarding strategies to reduce breast cancer risk including limiting alcohol intake, increasing physical activity and limiting fats in her diet. She was advised to continue with yearly mammograms as already advised.   She was provided with reading information about ALH.  All questions were answered. The patient knows to call the clinic with any problems, questions or concerns.  The total time spent in the appointment was 60 minutes and more than 50% was on counseling.    Molli Hazard, MD 01/29/2015 2:36 PM

## 2015-04-16 ENCOUNTER — Encounter (HOSPITAL_BASED_OUTPATIENT_CLINIC_OR_DEPARTMENT_OTHER): Payer: Self-pay | Admitting: *Deleted

## 2015-04-16 ENCOUNTER — Emergency Department (HOSPITAL_BASED_OUTPATIENT_CLINIC_OR_DEPARTMENT_OTHER)
Admission: EM | Admit: 2015-04-16 | Discharge: 2015-04-16 | Disposition: A | Discharge status: Home / Self Care | Payer: BLUE CROSS/BLUE SHIELD | Attending: Emergency Medicine | Admitting: Emergency Medicine

## 2015-04-16 DIAGNOSIS — Y9389 Activity, other specified: Secondary | ICD-10-CM | POA: Diagnosis not present

## 2015-04-16 DIAGNOSIS — Y92410 Unspecified street and highway as the place of occurrence of the external cause: Secondary | ICD-10-CM | POA: Diagnosis not present

## 2015-04-16 DIAGNOSIS — Y998 Other external cause status: Secondary | ICD-10-CM | POA: Insufficient documentation

## 2015-04-16 DIAGNOSIS — R519 Headache, unspecified: Secondary | ICD-10-CM

## 2015-04-16 DIAGNOSIS — S3992XA Unspecified injury of lower back, initial encounter: Secondary | ICD-10-CM | POA: Diagnosis not present

## 2015-04-16 DIAGNOSIS — Z7951 Long term (current) use of inhaled steroids: Secondary | ICD-10-CM | POA: Insufficient documentation

## 2015-04-16 DIAGNOSIS — M5489 Other dorsalgia: Secondary | ICD-10-CM

## 2015-04-16 DIAGNOSIS — S0990XA Unspecified injury of head, initial encounter: Secondary | ICD-10-CM | POA: Diagnosis not present

## 2015-04-16 DIAGNOSIS — R51 Headache: Secondary | ICD-10-CM

## 2015-04-16 MED ORDER — IBUPROFEN 800 MG PO TABS: 800.0000 mg | ORAL_TABLET | Freq: Three times a day (TID) | ORAL | Status: DC

## 2015-04-16 MED ORDER — IBUPROFEN 800 MG PO TABS
800.0000 mg | ORAL_TABLET | Freq: Once | ORAL | Status: AC
  Administered 2015-04-16: 800 mg via ORAL
  Filled 2015-04-16: qty 1

## 2015-04-16 MED ORDER — METHOCARBAMOL 500 MG PO TABS
500.0000 mg | ORAL_TABLET | Freq: Once | ORAL | Status: AC
  Administered 2015-04-16: 500 mg via ORAL
  Filled 2015-04-16: qty 1

## 2015-04-16 MED ORDER — METHOCARBAMOL 500 MG PO TABS: 500.0000 mg | ORAL_TABLET | Freq: Two times a day (BID) | ORAL | Status: DC | PRN

## 2015-04-16 MED FILL — IBUPROFEN 800 MG TABLET: 800 | 7 days supply | Qty: 21 | Fill #0

## 2015-04-16 MED FILL — METHOCARBAMOL 500 MG TABLET: 500 | 5 days supply | Qty: 10 | Fill #0
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
## 2015-04-16 NOTE — Discharge Instructions (Signed)
Take ibuprofen 3 times daily for the next 2 days, then as needed for pain. Take Robaxin (her muscle relaxer) on the as needed- This can make you very drowsy - please do not drink or drive on this medication.  Follow-up with your primary doctor in 2-3 days. Return to the ER for change in mental status, vomiting, new or worsening symptoms, any additional concerns.
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
## 2015-04-26 ENCOUNTER — Other Ambulatory Visit (HOSPITAL_COMMUNITY)
Admission: RE | Admit: 2015-04-26 | Discharge: 2015-04-26 | Disposition: A | Discharge status: Home / Self Care | Payer: BLUE CROSS/BLUE SHIELD | Source: Ambulatory Visit | Attending: Family Medicine | Admitting: Family Medicine

## 2015-04-26 ENCOUNTER — Encounter: Payer: Self-pay | Admitting: Family Medicine

## 2015-04-26 ENCOUNTER — Ambulatory Visit (INDEPENDENT_AMBULATORY_CARE_PROVIDER_SITE_OTHER): Payer: BLUE CROSS/BLUE SHIELD | Admitting: Family Medicine

## 2015-04-26 ENCOUNTER — Other Ambulatory Visit: Payer: Self-pay | Admitting: Family Medicine

## 2015-04-26 VITALS — BP 120/84 | HR 87 | Resp 16 | Ht 66.0 in | Wt 197.0 lb

## 2015-04-26 DIAGNOSIS — Z01419 Encounter for gynecological examination (general) (routine) without abnormal findings: Secondary | ICD-10-CM | POA: Insufficient documentation

## 2015-04-26 DIAGNOSIS — Z0000 Encounter for general adult medical examination without abnormal findings: Secondary | ICD-10-CM | POA: Diagnosis not present

## 2015-04-26 DIAGNOSIS — Z1151 Encounter for screening for human papillomavirus (HPV): Secondary | ICD-10-CM | POA: Diagnosis not present

## 2015-04-26 DIAGNOSIS — E559 Vitamin D deficiency, unspecified: Secondary | ICD-10-CM

## 2015-04-26 DIAGNOSIS — Z1211 Encounter for screening for malignant neoplasm of colon: Secondary | ICD-10-CM | POA: Diagnosis not present

## 2015-04-26 DIAGNOSIS — Z124 Encounter for screening for malignant neoplasm of cervix: Secondary | ICD-10-CM

## 2015-04-26 DIAGNOSIS — Z0001 Encounter for general adult medical examination with abnormal findings: Secondary | ICD-10-CM | POA: Insufficient documentation

## 2015-04-26 LAB — POC HEMOCCULT BLD/STL (OFFICE/1-CARD/DIAGNOSTIC): Fecal Occult Blood, POC: NEGATIVE

## 2015-04-26 MED ORDER — ERGOCALCIFEROL 1.25 MG (50000 UT) PO CAPS: 50000.0000 [IU] | ORAL_CAPSULE | ORAL | Status: DC

## 2015-04-26 NOTE — Progress Notes (Signed)
   Subjective:    Patient ID: Erin Forbes, female    DOB: 08-03-1970, 45 y.o.   MRN: [id number]  HPI Patient is in for annual physical exam. No other health concerns are expressed or addressed at the visit. Recent labs, if available are reviewed. Immunization is reviewed , and  updated if needed.   Review of Systems See HPI     Objective:   Physical Exam BP 120/84 mmHg  Pulse 87  Resp 16  Ht 5\' 6"  (1.676 m)  Wt 197 lb (89.359 kg)  BMI 31.81 kg/m2  SpO2 98%  LMP 04/16/2015   Pleasant well nourished female, alert and oriented x 3, in no cardio-pulmonary distress. Afebrile. HEENT No facial trauma or asymetry. Sinuses non tender.  Extra occullar muscles intact, pupils equally reactive to light. External ears normal, tympanic membranes clear. Oropharynx moist, no exudate, good dentition. Neck: supple, no adenopathy,JVD or thyromegaly.No bruits.  Chest: Clear to ascultation bilaterally.No crackles or wheezes. Non tender to palpation  Breast: No asymetry,no masses or lumps. No tenderness. No nipple discharge or inversion. No axillary or supraclavicular adenopathy  Cardiovascular system; Heart sounds normal,  S1 and  S2 ,no S3.  No murmur, or thrill. Apical beat not displaced Peripheral pulses normal.  Abdomen: Soft, non tender, no organomegaly or masses. No bruits. Bowel sounds normal. No guarding, tenderness or rebound.  Rectal:  Normal sphincter tone. No mass.No rectal masses.  Guaiac negative stool.  GU: External genitalia normal female genitalia , female distribution of hair. No lesions. Urethral meatus normal in size, no  Prolapse, no lesions visibly  Present. Bladder non tender. Vagina pink and moist , with no visible lesions , discharge present . Adequate pelvic support no  cystocele or rectocele noted Cervix pink and appears healthy, no lesions or ulcerations noted, no discharge noted from os Uterus enlargec, no adnexal masses, no cervical motion  or adnexal tenderness.   Musculoskeletal exam: Full ROM of spine, hips , shoulders and knees. No deformity ,swelling or crepitus noted. No muscle wasting or atrophy.   Neurologic: Cranial nerves 2 to 12 intact. Power, tone ,sensation and reflexes normal throughout. No disturbance in gait. No tremor.  Skin: Intact, no ulceration, erythema , scaling or rash noted. Pigmentation normal throughout  Psych; Normal mood and affect. Judgement and concentration normal        Assessment & Plan:  Annual physical exam Annual exam as documented. Counseling done  re healthy lifestyle involving commitment to 150 minutes exercise per week, heart healthy diet, and attaining healthy weight.The importance of adequate sleep also discussed. Regular seat belt use and home safety, is also discussed. Changes in health habits are decided on by the patient with goals and time frames  set for achieving them. Immunization and cancer screening needs are specifically addressed at this visit.

## 2015-04-26 NOTE — Assessment & Plan Note (Signed)

## 2015-04-26 NOTE — Patient Instructions (Addendum)
F/u in 6 month, call if you need me sooner  Labs today  CONGRATS on weight loss, keep it up  PLEASE call and schedule your mammogram past due    Thank you  for choosing [facility]. We consider it a privelige to serve you.  Delivering excellent health care in a caring and  compassionate way is our goal.  Partnering with you,  so that together we can achieve this goal is our strategy. Vit D is being refilled, please continue to take this

## 2015-04-27 LAB — LIPID PANEL
CHOL/HDL RATIO: 2.9 ratio (ref <=5.0)
CHOLESTEROL: 97 mg/dL — AB (ref 125–200)
HDL: 34 mg/dL — ABNORMAL LOW (ref >=46)
LDL Cholesterol: 40 mg/dL (ref <130)
Triglycerides: 114 mg/dL (ref <150)
VLDL: 23 mg/dL (ref <30)

## 2015-04-27 LAB — HEMOGLOBIN A1C
HEMOGLOBIN A1C: 5.8 % — AB (ref <5.7)
MEAN PLASMA GLUCOSE: 120 mg/dL

## 2015-04-27 LAB — CBC WITH DIFFERENTIAL/PLATELET
BASOS ABS: 0 {cells}/uL (ref 0–200)
BASOS PCT: 0 %
EOS ABS: 90 {cells}/uL (ref 15–500)
Eosinophils Relative: 1 %
HCT: 35.4 % (ref 35.0–45.0)
HEMOGLOBIN: 11.3 g/dL — AB (ref 11.7–15.5)
LYMPHS ABS: 2790 {cells}/uL (ref 850–3900)
Lymphocytes Relative: 31 %
MCH: 24.3 pg — AB (ref 27.0–33.0)
MCHC: 31.9 g/dL — ABNORMAL LOW (ref 32.0–36.0)
MCV: 76.1 fL — AB (ref 80.0–100.0)
MONO ABS: 720 {cells}/uL (ref 200–950)
MPV: 9.4 fL (ref 7.5–12.5)
Monocytes Relative: 8 %
NEUTROS ABS: 5400 {cells}/uL (ref 1500–7800)
Neutrophils Relative %: 60 %
PLATELETS: 337 10*3/uL (ref 140–400)
RBC: 4.65 MIL/uL (ref 3.80–5.10)
RDW: 18.3 % — ABNORMAL HIGH (ref 11.0–15.0)
WBC: 9 10*3/uL (ref 3.8–10.8)

## 2015-04-27 LAB — COMPREHENSIVE METABOLIC PANEL
ALT: 12 U/L (ref 6–29)
AST: 14 U/L (ref 10–30)
Albumin: 4.2 g/dL (ref 3.6–5.1)
Alkaline Phosphatase: 86 U/L (ref 33–115)
BUN: 8 mg/dL (ref 7–25)
CALCIUM: 9.3 mg/dL (ref 8.6–10.2)
CO2: 23 mmol/L (ref 20–31)
Chloride: 104 mmol/L (ref 98–110)
Creat: 0.71 mg/dL (ref 0.50–1.10)
GLUCOSE: 86 mg/dL (ref 65–99)
Potassium: 4 mmol/L (ref 3.5–5.3)
SODIUM: 138 mmol/L (ref 135–146)
Total Bilirubin: 0.5 mg/dL (ref 0.2–1.2)
Total Protein: 7.2 g/dL (ref 6.1–8.1)

## 2015-04-27 LAB — HIV ANTIBODY (ROUTINE TESTING W REFLEX): HIV: NONREACTIVE

## 2015-04-27 LAB — IRON: Iron: 53 ug/dL (ref 40–190)

## 2015-04-27 LAB — CYTOLOGY - PAP

## 2015-04-27 LAB — VITAMIN D 25 HYDROXY (VIT D DEFICIENCY, FRACTURES): VIT D 25 HYDROXY: 31 ng/mL (ref 30–100)

## 2015-04-27 LAB — TSH: TSH: 3.87 m[IU]/L

## 2015-04-27 LAB — FERRITIN: Ferritin: 19 ng/mL (ref 10–232)

## 2015-07-12 ENCOUNTER — Other Ambulatory Visit: Payer: Self-pay

## 2015-07-12 ENCOUNTER — Emergency Department (HOSPITAL_BASED_OUTPATIENT_CLINIC_OR_DEPARTMENT_OTHER)
Admission: EM | Admit: 2015-07-12 | Discharge: 2015-07-12 | Disposition: A | Discharge status: Home / Self Care | Payer: BLUE CROSS/BLUE SHIELD | Attending: Emergency Medicine | Admitting: Emergency Medicine

## 2015-07-12 ENCOUNTER — Emergency Department (HOSPITAL_BASED_OUTPATIENT_CLINIC_OR_DEPARTMENT_OTHER): Payer: BLUE CROSS/BLUE SHIELD

## 2015-07-12 ENCOUNTER — Encounter (HOSPITAL_BASED_OUTPATIENT_CLINIC_OR_DEPARTMENT_OTHER): Payer: Self-pay

## 2015-07-12 DIAGNOSIS — R42 Dizziness and giddiness: Secondary | ICD-10-CM | POA: Insufficient documentation

## 2015-07-12 DIAGNOSIS — Z87891 Personal history of nicotine dependence: Secondary | ICD-10-CM | POA: Insufficient documentation

## 2015-07-12 DIAGNOSIS — R079 Chest pain, unspecified: Secondary | ICD-10-CM | POA: Diagnosis present

## 2015-07-12 LAB — URINE MICROSCOPIC-ADD ON

## 2015-07-12 LAB — URINALYSIS, ROUTINE W REFLEX MICROSCOPIC
BILIRUBIN URINE: NEGATIVE
Glucose, UA: NEGATIVE mg/dL
KETONES UR: NEGATIVE mg/dL
Leukocytes, UA: NEGATIVE
NITRITE: NEGATIVE
PH: 7 (ref 5.0–8.0)
PROTEIN: NEGATIVE mg/dL
Specific Gravity, Urine: 1.006 (ref 1.005–1.030)

## 2015-07-12 MED ORDER — GI COCKTAIL ~~LOC~~
30.0000 mL | Freq: Once | ORAL | Status: AC
  Administered 2015-07-12: 30 mL via ORAL
  Filled 2015-07-12: qty 30

## 2015-07-12 NOTE — Discharge Instructions (Signed)
Try zantac 150mg twice a day.  °Nonspecific Chest Pain  °Chest pain can be caused by many different conditions. There is always a chance that your pain could be related to something serious, such as a heart attack or a blood clot in your lungs. Chest pain can also be caused by conditions that are not life-threatening. If you have chest pain, it is very important to follow up with your health care provider. °CAUSES  °Chest pain can be caused by: °· Heartburn. °· Pneumonia or bronchitis. °· Anxiety or stress. °· Inflammation around your heart (pericarditis) or lung (pleuritis or pleurisy). °· A blood clot in your lung. °· A collapsed lung (pneumothorax). It can develop suddenly on its own (spontaneous pneumothorax) or from trauma to the chest. °· Shingles infection (varicella-zoster virus). °· Heart attack. °· Damage to the bones, muscles, and cartilage that make up your chest wall. This can include: °¨ Bruised bones due to injury. °¨ Strained muscles or cartilage due to frequent or repeated coughing or overwork. °¨ Fracture to one or more ribs. °¨ Sore cartilage due to inflammation (costochondritis). °RISK FACTORS  °Risk factors for chest pain may include: °· Activities that increase your risk for trauma or injury to your chest. °· Respiratory infections or conditions that cause frequent coughing. °· Medical conditions or overeating that can cause heartburn. °· Heart disease or family history of heart disease. °· Conditions or health behaviors that increase your risk of developing a blood clot. °· Having had chicken pox (varicella zoster). °SIGNS AND SYMPTOMS °Chest pain can feel like: °· Burning or tingling on the surface of your chest or deep in your chest. °· Crushing, pressure, aching, or squeezing pain. °· Dull or sharp pain that is worse when you move, cough, or take a deep breath. °· Pain that is also felt in your back, neck, shoulder, or arm, or pain that spreads to any of these areas. °Your chest pain may  come and go, or it may stay constant. °DIAGNOSIS °Lab tests or other studies may be needed to find the cause of your pain. Your health care provider may have you take a test called an ambulatory ECG (electrocardiogram). An ECG records your heartbeat patterns at the time the test is performed. You may also have other tests, such as: °· Transthoracic echocardiogram (TTE). During echocardiography, sound waves are used to create a picture of all of the heart structures and to look at how blood flows through your heart. °· Transesophageal echocardiogram (TEE). This is a more advanced imaging test that obtains images from inside your body. It allows your health care provider to see your heart in finer detail. °· Cardiac monitoring. This allows your health care provider to monitor your heart rate and rhythm in real time. °· Holter monitor. This is a portable device that records your heartbeat and can help to diagnose abnormal heartbeats. It allows your health care provider to track your heart activity for several days, if needed. °· Stress tests. These can be done through exercise or by taking medicine that makes your heart beat more quickly. °· Blood tests. °· Imaging tests. °TREATMENT  °Your treatment depends on what is causing your chest pain. Treatment may include: °· Medicines. These may include: °¨ Acid blockers for heartburn. °¨ Anti-inflammatory medicine. °¨ Pain medicine for inflammatory conditions. °¨ Antibiotic medicine, if an infection is present. °¨ Medicines to dissolve blood clots. °¨ Medicines to treat coronary artery disease. °· Supportive care for conditions that do not require medicines. This   may include: °¨ Resting. °¨ Applying heat or cold packs to injured areas. °¨ Limiting activities until pain decreases. °HOME CARE INSTRUCTIONS °· If you were prescribed an antibiotic medicine, finish it all even if you start to feel better. °· Avoid any activities that bring on chest pain. °· Do not use any tobacco  products, including cigarettes, chewing tobacco, or electronic cigarettes. If you need help quitting, ask your health care provider. °· Do not drink alcohol. °· Take medicines only as directed by your health care provider. °· Keep all follow-up visits as directed by your health care provider. This is important. This includes any further testing if your chest pain does not go away. °· If heartburn is the cause for your chest pain, you may be told to keep your head raised (elevated) while sleeping. This reduces the chance that acid will go from your stomach into your esophagus. °· Make lifestyle changes as directed by your health care provider. These may include: °¨ Getting regular exercise. Ask your health care provider to suggest some activities that are safe for you. °¨ Eating a heart-healthy diet. A registered dietitian can help you to learn healthy eating options. °¨ Maintaining a healthy weight. °¨ Managing diabetes, if necessary. °¨ Reducing stress. °SEEK MEDICAL CARE IF: °· Your chest pain does not go away after treatment. °· You have a rash with blisters on your chest. °· You have a fever. °SEEK IMMEDIATE MEDICAL CARE IF:  °· Your chest pain is worse. °· You have an increasing cough, or you cough up blood. °· You have severe abdominal pain. °· You have severe weakness. °· You faint. °· You have chills. °· You have sudden, unexplained chest discomfort. °· You have sudden, unexplained discomfort in your arms, back, neck, or jaw. °· You have shortness of breath at any time. °· You suddenly start to sweat, or your skin gets clammy. °· You feel nauseous or you vomit. °· You suddenly feel light-headed or dizzy. °· Your heart begins to beat quickly, or it feels like it is skipping beats. °These symptoms may represent a serious problem that is an emergency. Do not wait to see if the symptoms will go away. Get medical help right away. Call your local emergency services (911 in the [place] Do not drive yourself to the  hospital. °  °This information is not intended to replace advice given to you by your health care provider. Make sure you discuss any questions you have with your health care provider. °  °Document Released: 10/16/2004 Document Revised: 01/27/2014 Document Reviewed: 08/12/2013 °Elsevier Interactive Patient Education ©2016 [employer] ° °

## 2015-07-12 NOTE — ED Notes (Signed)
CP stared last night-NAD-denies CP at present

## 2015-07-12 NOTE — ED Provider Notes (Signed)
CSN: [id number]     Arrival date & time 07/12/15  1841 History  By signing my name below, I, Erin Forbes, attest that this documentation has been prepared under the direction and in the presence of Erin Etienne, DO.  Electronically Signed: Tedra Forbes. Sheppard Coil, ED Scribe. 07/12/2015. 7:19 PM.     Chief Complaint  Patient presents with  . Chest Pain   The history is provided by the patient. No language interpreter was used.   HPI Comments: Erin Forbes is a 45 y.o. female who presents to the Emergency Department complaining of gradual onset, intermittent, "pressure-like" substernal chest pain x 1 day PTA. Pt reports she was walking around [place] when pain began. She notes that pain only lasts for a few seconds and then shortly relieves. Pt states she has associated left-sided facial swelling, left jaw pain, lightheadedness, and urinary frequency. There are no modifying factors. Pt does not take any daily medications. Pt is not a smoker. She has not had any recent long trips or recent surgeries. Denies any SOB or fever.   Pt also notes of a sharp, intermittent, LLQ abdominal pain x 1 month. She has PSHx of Cholecystectomy. She notes having normal bowel movements. Denies any dysuria.   Past Medical History  Diagnosis Date  . Headache 2000   Past Surgical History  Procedure Laterality Date  . Cholecystectomy    . Cesarean section  1995, 2002    two  . Mass excision Bilateral 10/30/2014    Procedure: BILATERAL EXCISION ACCESSORY BREAST TISSUE;  Surgeon: Erin Polio, MD;  Location: [facility];  Service: Plastics;  Laterality: Bilateral;  . Breast reduction surgery Bilateral 10/30/2014    Procedure: BREAST REDUCTION WITH LIPOSUCTION;  Surgeon: Erin Polio, MD;  Location: [facility];  Service: Plastics;  Laterality: Bilateral;   Family History  Problem Relation Age of Onset  . Stroke Mother 68  . Hypertension Mother   . Hypertension Father   .  Hypertension Sister   . Hypertension Brother    Social History  Substance Use Topics  . Smoking status: Former Research scientist (life sciences)  . Smokeless tobacco: Never Used  . Alcohol Use: Yes     Comment: occ   OB History    No data available     Review of Systems  Constitutional: Negative for fever and chills.  HENT: Positive for facial swelling. Negative for congestion and rhinorrhea.   Eyes: Negative for redness and visual disturbance.  Respiratory: Negative for shortness of breath and wheezing.   Cardiovascular: Positive for chest pain. Negative for palpitations.  Gastrointestinal: Negative for nausea and vomiting.  Genitourinary: Positive for frequency. Negative for dysuria and urgency.  Musculoskeletal: Negative for myalgias and arthralgias.  Skin: Negative for pallor and wound.  Neurological: Positive for light-headedness. Negative for dizziness and headaches.  All other systems reviewed and are negative.   Allergies  Prednisone  Home Medications   Prior to Admission medications   Not on File   BP 126/95 mmHg  Pulse 79  Temp(Src) 98.3 F (36.8 C) (Oral)  Resp 20  Ht 5\' 6"  (1.676 m)  Wt 196 lb (88.905 kg)  BMI 31.65 kg/m2  SpO2 99%  LMP 06/16/2015 Physical Exam  Constitutional: She is oriented to person, place, and time. She appears well-developed and well-nourished. No distress.  HENT:  Head: Normocephalic and atraumatic.  Eyes: EOM are normal. Pupils are equal, round, and reactive to light.  Neck: Normal range of motion. Neck supple.  Cardiovascular: Normal rate, regular rhythm, normal heart sounds and intact distal pulses.  Exam reveals no gallop and no friction rub.   No murmur heard. Pulmonary/Chest: Effort normal and breath sounds normal. She has no wheezes. She has no rales.  Abdominal: Soft. Bowel sounds are normal. She exhibits no distension. There is tenderness.  Mild LLQ tenderness.   Musculoskeletal: She exhibits no edema or tenderness.  Neurological: She is  alert and oriented to person, place, and time.  Skin: Skin is warm and dry. She is not diaphoretic.  Psychiatric: She has a normal mood and affect. Her behavior is normal.  Nursing note and vitals reviewed.   ED Course  Procedures (including critical care time) DIAGNOSTIC STUDIES: Oxygen Saturation is 100% on RA, normal by my interpretation.    COORDINATION OF CARE: 7:04 PM-Discussed treatment plan which includes CXR, UA, EKG, and GI Cocktail with pt at bedside and pt agreed to plan.   Labs Review Labs Reviewed  URINALYSIS, ROUTINE W REFLEX MICROSCOPIC (NOT AT [facility]) - Abnormal; Notable for the following:    Hgb urine dipstick TRACE (*)    All other components within normal limits  URINE MICROSCOPIC-ADD ON - Abnormal; Notable for the following:    Squamous Epithelial / LPF 0-5 (*)    Bacteria, UA FEW (*)    All other components within normal limits    Imaging Review Dg Chest 2 View  07/12/2015  CLINICAL DATA:  Lt side chest pain with some SOB and dizziness x 2 days. HX: Former smoker EXAM: CHEST - 2 VIEW COMPARISON:  07/18/2011 FINDINGS: Lungs are clear. Heart size and mediastinal contours are within normal limits. No effusion. Prominent left lateral endplate spur left [id number]. Cholecystectomy clips. IMPRESSION: No acute cardiopulmonary disease. Electronically Signed   By: [name] M.D.   On: 07/12/2015 20:15   I have personally reviewed and evaluated these images and lab results as part of my medical decision-making.   EKG Interpretation   Date/Time:  Thursday July 12 2015 18:47:03 EDT Ventricular Rate:  71 PR Interval:  134 QRS Duration: 72 QT Interval:  356 QTC Calculation: 386 R Axis:   52 Text Interpretation:  Normal sinus rhythm Normal ECG No significant change  since last tracing Confirmed by [name] MD, [name] [id number]) on 07/12/2015  8:23:21 PM      MDM   Final diagnoses:  Chest pain, unspecified chest pain type    45 yo F With atypical chest pain. Pain only  last for seconds at a time and goes. The more likely to be reflux. She is very low risk for cardiac disease. Do not feel that laboratory evaluation as part of this time. PERC negative. Will give the patient a GI cocktail EKG and chest x-ray. Check UA as patient is also having urinary symptoms and some suprapubic and left lower quadrant tenderness.  I personally performed the services described in this documentation, which was scribed in my presence. The recorded information has been reviewed and is accurate.   Chest x-ray EKG and UA unremarkable. Discharge home.  8:26 PM:  I have discussed the diagnosis/risks/treatment options with the patient and family and believe the pt to be eligible for discharge home to follow-up with PCP. We also discussed returning to the ED immediately if new or worsening sx occur. We discussed the sx which are most concerning (e.g., sudden worsening pain, fever, inability to tolerate by mouth) that necessitate immediate return. Medications administered to the patient during their visit and any new  prescriptions provided to the patient are listed below.  Medications given during this visit Medications  gi cocktail (Maalox,Lidocaine,Donnatal) (30 mLs Oral Given 07/12/15 1923)    New Prescriptions   No medications on file    The patient appears reasonably screen and/or stabilized for discharge and I doubt any other medical condition or other [facility] requiring further screening, evaluation, or treatment in the ED at this time prior to discharge.     Erin Etienne, DO 07/12/15 2026

## 2015-10-25 ENCOUNTER — Ambulatory Visit: Payer: BLUE CROSS/BLUE SHIELD | Admitting: Family Medicine

## 2015-11-15 ENCOUNTER — Ambulatory Visit: Payer: BLUE CROSS/BLUE SHIELD | Admitting: Family Medicine

## 2016-02-27 ENCOUNTER — Telehealth: Payer: Self-pay

## 2016-02-27 MED ORDER — OSELTAMIVIR PHOSPHATE 75 MG PO CAPS: 75.0000 mg | ORAL_CAPSULE | Freq: Two times a day (BID) | ORAL | 0 refills | Status: AC

## 2016-02-27 NOTE — Telephone Encounter (Signed)
Acute onset of fever Yes.   Headache Yes.   Body ache Yes.   Fatigue Yes.   Non productive cough Yes.   Sore throat Yes.   Nasal discharge Yes.    Onset between 1-4 days of possible exposure Yes.    Recommendation:  Fluid, rest, Tylenol for control of fever  Expect 5 days before feeling better and not so contagious and generally better in 7-10 days.  Standard treatment Tama flu 75 mg 1 tablet twice daily times 5 days  Please call office if symptoms do not improve or worsen in 5 days after beginning treatment.     Patient is asking for work excuse as well.    She will call back and schedule a regular follow up visit once she is better.

## 2016-02-27 NOTE — Telephone Encounter (Signed)
Work excuse for 1 week from symptom onset

## 2016-02-27 NOTE — Telephone Encounter (Signed)
Called and left message for patient to return call.  

## 2016-02-28 NOTE — Telephone Encounter (Signed)
Patient aware and she will call back with a fax #

## 2018-02-24 DIAGNOSIS — L42 Pityriasis rosea: Secondary | ICD-10-CM | POA: Diagnosis not present

## 2018-02-25 DIAGNOSIS — R05 Cough: Secondary | ICD-10-CM | POA: Diagnosis not present

## 2018-02-25 DIAGNOSIS — R21 Rash and other nonspecific skin eruption: Secondary | ICD-10-CM | POA: Diagnosis not present

## 2018-02-25 DIAGNOSIS — J189 Pneumonia, unspecified organism: Secondary | ICD-10-CM | POA: Diagnosis not present

## 2018-02-26 DIAGNOSIS — B369 Superficial mycosis, unspecified: Secondary | ICD-10-CM | POA: Diagnosis not present

## 2018-04-12 DIAGNOSIS — R05 Cough: Secondary | ICD-10-CM | POA: Diagnosis not present

## 2018-06-16 DIAGNOSIS — M79672 Pain in left foot: Secondary | ICD-10-CM | POA: Diagnosis not present

## 2018-07-01 DIAGNOSIS — L723 Sebaceous cyst: Secondary | ICD-10-CM | POA: Diagnosis not present

## 2018-07-29 DIAGNOSIS — Z20828 Contact with and (suspected) exposure to other viral communicable diseases: Secondary | ICD-10-CM | POA: Diagnosis not present

## 2018-08-10 ENCOUNTER — Other Ambulatory Visit: Payer: Self-pay | Admitting: *Deleted

## 2018-08-10 DIAGNOSIS — Z20822 Contact with and (suspected) exposure to covid-19: Secondary | ICD-10-CM

## 2018-08-28 ENCOUNTER — Emergency Department (HOSPITAL_COMMUNITY)
Admission: EM | Admit: 2018-08-28 | Discharge: 2018-08-28 | Disposition: A | Discharge status: Home / Self Care | Payer: Self-pay | Attending: Emergency Medicine | Admitting: Emergency Medicine

## 2018-08-28 ENCOUNTER — Other Ambulatory Visit: Payer: Self-pay

## 2018-08-28 ENCOUNTER — Encounter (HOSPITAL_COMMUNITY): Payer: Self-pay

## 2018-08-28 ENCOUNTER — Emergency Department (HOSPITAL_COMMUNITY): Payer: Self-pay

## 2018-08-28 DIAGNOSIS — Z79899 Other long term (current) drug therapy: Secondary | ICD-10-CM | POA: Insufficient documentation

## 2018-08-28 DIAGNOSIS — R0789 Other chest pain: Secondary | ICD-10-CM | POA: Insufficient documentation

## 2018-08-28 LAB — CBC
HCT: 37.7 % (ref 36.0–46.0)
Hemoglobin: 11.8 g/dL — ABNORMAL LOW (ref 12.0–15.0)
MCH: 26.6 pg (ref 26.0–34.0)
MCHC: 31.3 g/dL (ref 30.0–36.0)
MCV: 85.1 fL (ref 80.0–100.0)
Platelets: 235 10*3/uL (ref 150–400)
RBC: 4.43 MIL/uL (ref 3.87–5.11)
RDW: 14.7 % (ref 11.5–15.5)
WBC: 7.3 10*3/uL (ref 4.0–10.5)
nRBC: 0 % (ref 0.0–0.2)

## 2018-08-28 LAB — BASIC METABOLIC PANEL
Anion gap: 5 (ref 5–15)
BUN: 8 mg/dL (ref 6–20)
CO2: 27 mmol/L (ref 22–32)
Calcium: 8.9 mg/dL (ref 8.9–10.3)
Chloride: 107 mmol/L (ref 98–111)
Creatinine, Ser: 0.84 mg/dL (ref 0.44–1.00)
GFR calc Af Amer: >60 mL/min (ref >60)
GFR calc non Af Amer: >60 mL/min (ref >60)
Glucose, Bld: 109 mg/dL — ABNORMAL HIGH (ref 70–99)
Potassium: 3.4 mmol/L — ABNORMAL LOW (ref 3.5–5.1)
Sodium: 139 mmol/L (ref 135–145)

## 2018-08-28 LAB — TROPONIN I (HIGH SENSITIVITY): Troponin I (High Sensitivity): 2 ng/L (ref <18)

## 2018-08-28 LAB — HCG, QUANTITATIVE, PREGNANCY: hCG, Beta Chain, Quant, S: 1 m[IU]/mL (ref <5)

## 2018-08-28 MED ORDER — KETOROLAC TROMETHAMINE 30 MG/ML IJ SOLN
30.0000 mg | Freq: Once | INTRAMUSCULAR | Status: AC
  Administered 2018-08-28: 17:00:00 30 mg via INTRAMUSCULAR
  Filled 2018-08-28: qty 1

## 2018-08-28 MED ORDER — IBUPROFEN 600 MG PO TABS: 600.0000 mg | ORAL_TABLET | Freq: Four times a day (QID) | ORAL | 0 refills | Status: DC | PRN

## 2018-08-28 MED ORDER — METHOCARBAMOL 750 MG PO TABS: 750.0000 mg | ORAL_TABLET | Freq: Four times a day (QID) | ORAL | 0 refills | Status: DC

## 2018-08-28 MED ORDER — METHOCARBAMOL 500 MG PO TABS
500.0000 mg | ORAL_TABLET | Freq: Once | ORAL | Status: AC
  Administered 2018-08-28: 17:00:00 500 mg via ORAL
  Filled 2018-08-28: qty 1

## 2018-08-28 MED ORDER — SODIUM CHLORIDE 0.9% FLUSH
3.0000 mL | Freq: Once | INTRAVENOUS | Status: AC
  Administered 2018-08-28: 17:00:00 3 mL via INTRAVENOUS

## 2018-08-28 NOTE — ED Notes (Signed)
Pt placed on telemetry.

## 2018-08-28 NOTE — ED Notes (Signed)
Pt reports several day history of midsternal CP/epigastric pain since WEds  PCP [facility]  Here for eval

## 2018-08-28 NOTE — ED Triage Notes (Signed)
Pt reports tightness in chest since wedneday. Has been taking OTC antacids with no relief. Pain has increased and now unable to put head to chest nor move head to the left.

## 2018-08-28 NOTE — Discharge Instructions (Addendum)
Your labs, chest xray and ekg are normal today.  I suspect your symptoms are from chest wall strain (muscles/connective tissue).  Use the medicines prescribed as well as application of a heating pad for 20 minutes several times daily.

## 2018-08-28 NOTE — ED Provider Notes (Signed)
[facility] EMERGENCY DEPARTMENT Provider Note   CSN: [id number] Arrival date & time: 08/28/18  1404    History   Chief Complaint Chief Complaint  Patient presents with  . Chest Pain    HPI AHNA KONKLE is a 48 y.o. female with a past medical history as outlined below, presenting with a 3 day history of right sided chest pain, described as aching and constant, started out mild but escalated today.  Worsened with movement, particularly with right shoulder movement and with head rotation to the left.  She denies sob, n/v, pleuritic pain, but is not reproduced with palpation. She additionally denies fevers, chills, cough, no abdominal pain, dizziness, extremity edema, pain or weakness.  She denies any specific injury or overuse, but helped prepare for her sisters wedding the day of and day before sx began, she states may have lifted something wrong.  She has taken antacids for a few days which have not improved sx, took asa today also without relief. Distant smoking hx. No fam hx of early CAD.      HPI  Past Medical History:  Diagnosis Date  . Headache 2000    Patient Active Problem List   Diagnosis Date Noted  . Annual physical exam 04/26/2015  . Atypical lobular hyperplasia of right breast 01/29/2015  . Seasonal allergies 12/26/2014  . Thoracic back pain 08/23/2014  . Macromastia 08/23/2014  . Insomnia 02/24/2013  . Prediabetes 10/30/2012  . Vitamin D deficiency 06/27/2012  . Migraine headache without aura 07/22/2011  . Anemia, iron deficiency 12/06/2008  . Leiomyoma of uterus 07/03/2008  . Obese 07/03/2008    Past Surgical History:  Procedure Laterality Date  . BREAST REDUCTION SURGERY Bilateral 10/30/2014   Procedure: BREAST REDUCTION WITH LIPOSUCTION;  Surgeon: [name], MD;  Location: [facility];  Service: Plastics;  Laterality: Bilateral;  . [place], 2002   two  . CHOLECYSTECTOMY    . MASS EXCISION Bilateral 10/30/2014   Procedure: BILATERAL EXCISION ACCESSORY BREAST TISSUE;  Surgeon: [name], MD;  Location: [place];  Service: Plastics;  Laterality: Bilateral;     OB History   No obstetric history on file.      Home Medications    Prior to Admission medications   Medication Sig Start Date End Date Taking? Authorizing Provider  meloxicam (MOBIC) 15 MG tablet Take 15 mg by mouth daily. 06/16/18  Yes [provider]  ibuprofen (ADVIL) 600 MG tablet Take 1 tablet (600 mg total) by mouth every 6 (six) hours as needed. 08/28/18   Evalee Jefferson, PA-C  methocarbamol (ROBAXIN-750) 750 MG tablet Take 1 tablet (750 mg total) by mouth 4 (four) times daily. 08/28/18   Evalee Jefferson, PA-C    Family History Family History  Problem Relation Age of Onset  . Stroke Mother 70  . Hypertension Mother   . Hypertension Father   . Hypertension Sister   . Hypertension Brother     Social History Social History   Tobacco Use  . Smoking status: Former Research scientist (life sciences)  . Smokeless tobacco: Never Used  Substance Use Topics  . Alcohol use: Yes    Comment: occ  . Drug use: No     Allergies   Prednisone   Review of Systems Review of Systems  Constitutional: Negative for diaphoresis and fever.  HENT: Negative for congestion and sore throat.   Eyes: Negative.   Respiratory: Negative for cough, chest tightness, shortness of breath and wheezing.   Cardiovascular:  Positive for chest pain. Negative for palpitations and leg swelling.  Gastrointestinal: Negative for abdominal pain, nausea and vomiting.  Genitourinary: Negative.   Musculoskeletal: Positive for neck pain. Negative for arthralgias, joint swelling and neck stiffness.  Skin: Negative.  Negative for rash and wound.  Neurological: Negative for dizziness, weakness, light-headedness, numbness and headaches.  Psychiatric/Behavioral: Negative.      Physical Exam Updated Vital Signs BP 107/74   Pulse (!) 58   Temp 98 F (36.7 C) (Oral)    Resp 20   Ht 5\' 6"  (1.676 m)   Wt 87.1 kg   LMP 08/04/2018   SpO2 97%   BMI 30.99 kg/m   Physical Exam Vitals signs and nursing note reviewed.  Constitutional:      Appearance: She is well-developed.  HENT:     Head: Normocephalic and atraumatic.  Eyes:     Conjunctiva/sclera: Conjunctivae normal.  Neck:     Musculoskeletal: Normal range of motion.  Cardiovascular:     Rate and Rhythm: Normal rate and regular rhythm.     Pulses:          Radial pulses are 2+ on the right side and 2+ on the left side.       Dorsalis pedis pulses are 2+ on the right side and 2+ on the left side.     Heart sounds: Normal heart sounds.  Pulmonary:     Effort: Pulmonary effort is normal. No respiratory distress.     Breath sounds: Normal breath sounds. No decreased breath sounds, wheezing or rhonchi.  Chest:     Chest wall: No tenderness, crepitus or edema.  Abdominal:     General: Bowel sounds are normal.     Palpations: Abdomen is soft.     Tenderness: There is no abdominal tenderness.  Musculoskeletal: Normal range of motion.     Right lower leg: No edema.     Left lower leg: No edema.  Skin:    General: Skin is warm and dry.  Neurological:     Mental Status: She is alert.      ED Treatments / Results  Labs (all labs ordered are listed, but only abnormal results are displayed) Labs Reviewed  BASIC METABOLIC PANEL - Abnormal; Notable for the following components:      Result Value   Potassium 3.4 (*)    Glucose, Bld 109 (*)    All other components within normal limits  CBC - Abnormal; Notable for the following components:   Hemoglobin 11.8 (*)    All other components within normal limits  HCG, QUANTITATIVE, PREGNANCY  TROPONIN I (HIGH SENSITIVITY)    EKG None  Radiology Dg Chest 2 View  Result Date: 08/28/2018 CLINICAL DATA:  Chest tightness. EXAM: CHEST - 2 VIEW COMPARISON:  July 12, 2015 FINDINGS: Cardiomediastinal silhouette is normal. Mediastinal contours appear  intact. There is no evidence of focal airspace consolidation, pleural effusion or pneumothorax. Osseous structures are without acute abnormality. Soft tissues are grossly normal. IMPRESSION: No active cardiopulmonary disease. Electronically Signed   By: Fidela Salisbury M.D.   On: 08/28/2018 15:35    Procedures Procedures (including critical care time)  Medications Ordered in ED Medications  sodium chloride flush (NS) 0.9 % injection 3 mL (3 mLs Intravenous Given 08/28/18 1648)  ketorolac (TORADOL) 30 MG/ML injection 30 mg (30 mg Intramuscular Given 08/28/18 1653)  methocarbamol (ROBAXIN) tablet 500 mg (500 mg Oral Given 08/28/18 1654)     Initial Impression / Assessment and Plan /  ED Course  I have reviewed the triage vital signs and the nursing notes.  Pertinent labs & imaging results that were available during my care of the patient were reviewed by me and considered in my medical decision making (see chart for details).        Exam and hx suggesting msk source of sx, although not reproducible on exam to direct palpation, her sx do escalate with neck rotation and flexion (in chest, no neck pain).  toradol IM, robaxin PO given, pending troponin x 1 given 3 day hx, cxr clear, ekg NSR.  Labs, ekg, cxr reassuring.  MSK most likely source.  Ibuprofen, robaxin, heat tx.  Prn f/u pcp   Final Clinical Impressions(s) / ED Diagnoses   Final diagnoses:  Chest wall pain    ED Discharge Orders         Ordered    methocarbamol (ROBAXIN-750) 750 MG tablet  4 times daily     08/28/18 1827    ibuprofen (ADVIL) 600 MG tablet  Every 6 hours PRN     08/28/18 1827           Evalee Jefferson, PA-C 08/28/18 Katheran Awe, MD 08/29/18 1942

## 2019-10-18 ENCOUNTER — Ambulatory Visit (INDEPENDENT_AMBULATORY_CARE_PROVIDER_SITE_OTHER): Payer: BC Managed Care – PPO | Admitting: Internal Medicine

## 2019-10-18 ENCOUNTER — Encounter (INDEPENDENT_AMBULATORY_CARE_PROVIDER_SITE_OTHER): Payer: Self-pay

## 2019-10-18 ENCOUNTER — Other Ambulatory Visit: Payer: Self-pay

## 2019-10-18 ENCOUNTER — Ambulatory Visit (HOSPITAL_COMMUNITY)
Admission: RE | Admit: 2019-10-18 | Discharge: 2019-10-18 | Disposition: A | Discharge status: Home / Self Care | Payer: BC Managed Care – PPO | Source: Ambulatory Visit | Attending: Internal Medicine | Admitting: Internal Medicine

## 2019-10-18 VITALS — BP 130/84 | HR 77 | Temp 97.1°F | Resp 18 | Ht 65.5 in | Wt 187.4 lb

## 2019-10-18 DIAGNOSIS — S93401A Sprain of unspecified ligament of right ankle, initial encounter: Secondary | ICD-10-CM

## 2019-10-18 DIAGNOSIS — Z124 Encounter for screening for malignant neoplasm of cervix: Secondary | ICD-10-CM

## 2019-10-18 DIAGNOSIS — Z7689 Persons encountering health services in other specified circumstances: Secondary | ICD-10-CM

## 2019-10-18 DIAGNOSIS — Z1231 Encounter for screening mammogram for malignant neoplasm of breast: Secondary | ICD-10-CM | POA: Diagnosis not present

## 2019-10-18 DIAGNOSIS — M25471 Effusion, right ankle: Secondary | ICD-10-CM

## 2019-10-18 DIAGNOSIS — E669 Obesity, unspecified: Secondary | ICD-10-CM

## 2019-10-18 DIAGNOSIS — M7989 Other specified soft tissue disorders: Secondary | ICD-10-CM | POA: Diagnosis not present

## 2019-10-18 DIAGNOSIS — N62 Hypertrophy of breast: Secondary | ICD-10-CM

## 2019-10-18 DIAGNOSIS — Z1211 Encounter for screening for malignant neoplasm of colon: Secondary | ICD-10-CM

## 2019-10-18 MED ORDER — MELOXICAM 15 MG PO TABS: 15.0000 mg | ORAL_TABLET | Freq: Every day | ORAL | 1 refills | Status: DC | PRN

## 2019-10-18 NOTE — Assessment & Plan Note (Signed)
S/p twisting injury while walking on uneven surface 2 months ago Swelling improved now, although still has pain, swelling and warmth Will check X-ray of the right ankle to r/o fracture, plan to obtain MRI if X-ray negative with persistent symptoms Mobic PRN for pain and swelling

## 2019-10-18 NOTE — Progress Notes (Signed)
New Patient Office Visit  Subjective:  Patient ID: Erin Forbes, female    DOB: Sep 22, 1970  Age: 49 y.o. MRN: [id number]  CC:  Chief Complaint  Patient presents with  . New Patient (Initial Visit)    new pt former dr Moshe Cipro pt however she has not been seen in a while so this will be a new pt visit would like to have blood work ordered also fell and twisted right ankle in August it still does not feel well and it is still swollen     HPI Erin Forbes is a 49 year old female with past medical history of seasonal allergies, questionable hypothyroidism and s/p breast reduction surgery presents for establishing care.  Patient had twisting injury while walking on an uneven surface, after which she had a right ankle swelling and pain.  Patient did not get any medical attention at that time.  Patient states that the swelling has improved since, but she still has persistent pain, which she describes as sharp, nonradiating, constant, worse with downward and outward movement and relieved with rest.  Patient denies any numbness, weakness, or tingling over the right foot.  Of note, patient states that she was diagnosed with thyroid problem, and was prescribed thyroid hormone supplementation, which she stopped taking without medical advice.  She denies any recent weight or appetite changes, constipation, diarrhea, but does admit to brittle hair.  Patient has not had medical evaluation for last few years.  Last Pap test was in 2017.  Last mammography was done in 2015.  Patient has not had any colonoscopy done.  Patient has not had Covid vaccine yet.  She wanted to get blood test done before she gets the Covid vaccine.  Patient was counseled about getting the Covid vaccine as soon as possible regardless of the blood test.  Patient does not want flu vaccine today.  Past Medical History:  Diagnosis Date  . Headache 2000    Past Surgical History:  Procedure Laterality Date  . BREAST REDUCTION  SURGERY Bilateral 10/30/2014   Procedure: BREAST REDUCTION WITH LIPOSUCTION;  Surgeon: [name], MD;  Location: [facility];  Service: Plastics;  Laterality: Bilateral;  . BREAST SURGERY N/A    Phreesia 10/18/2019  . [place], 2002   two  . CESAREAN SECTION N/A    Phreesia 10/18/2019  . CHOLECYSTECTOMY    . MASS EXCISION Bilateral 10/30/2014   Procedure: BILATERAL EXCISION ACCESSORY BREAST TISSUE;  Surgeon: [name], MD;  Location: [facility];  Service: Plastics;  Laterality: Bilateral;    Family History  Problem Relation Age of Onset  . Stroke Mother 59  . Hypertension Mother   . Hypertension Father   . Hypertension Sister   . Hypertension Brother     Social History   Socioeconomic History  . Marital status: Single    Spouse name: Not on file  . Number of children: Not on file  . Years of education: Not on file  . Highest education level: Not on file  Occupational History    Employer: [employer]  Tobacco Use  . Smoking status: Former Smoker    Types: Cigarettes  . Smokeless tobacco: Never Used  . Tobacco comment: Quit 19 years ago, occasionally used to smoke 1 cigarette, smoked for about 2 years  Substance and Sexual Activity  . Alcohol use: Yes    Comment: occ  . Drug use: No  . Sexual activity: Not on file  Other  Topics Concern  . Not on file  Social History Narrative  . Not on file   Social Determinants of Health   Financial Resource Strain:   . Difficulty of Paying Living Expenses: Not on file  Food Insecurity:   . Worried About Charity fundraiser in the Last Year: Not on file  . Ran Out of Food in the Last Year: Not on file  Transportation Needs:   . Lack of Transportation (Medical): Not on file  . Lack of Transportation (Non-Medical): Not on file  Physical Activity:   . Days of Exercise per Week: Not on file  . Minutes of Exercise per Session: Not on file  Stress:   . Feeling of Stress  : Not on file  Social Connections:   . Frequency of Communication with Friends and Family: Not on file  . Frequency of Social Gatherings with Friends and Family: Not on file  . Attends Religious Services: Not on file  . Active Member of Clubs or Organizations: Not on file  . Attends Archivist Meetings: Not on file  . Marital Status: Not on file  Intimate Partner Violence:   . Fear of Current or Ex-Partner: Not on file  . Emotionally Abused: Not on file  . Physically Abused: Not on file  . Sexually Abused: Not on file    ROS Review of Systems  Constitutional: Negative for chills and fever.  HENT: Negative for congestion, sinus pressure, sinus pain and sore throat.   Eyes: Negative for pain and discharge.  Respiratory: Negative for cough and shortness of breath.   Cardiovascular: Negative for chest pain and palpitations.  Gastrointestinal: Negative for abdominal pain, constipation, diarrhea, nausea and vomiting.  Endocrine: Negative for polydipsia and polyuria.  Genitourinary: Negative for dysuria and hematuria.  Musculoskeletal: Negative for neck pain and neck stiffness.       Right ankle pain and swelling  Skin: Negative for rash.  Neurological: Negative for dizziness and weakness.  Psychiatric/Behavioral: Negative for agitation and behavioral problems.    Objective:   Today's Vitals: BP 130/84 (BP Location: Right Arm, Patient Position: Sitting, Cuff Size: Normal)   Pulse 77   Temp (!) 97.1 F (36.2 C) (Temporal)   Resp 18   Ht 5' 5.5" (1.664 m)   Wt 187 lb 6.4 oz (85 kg)   SpO2 98%   BMI 30.71 kg/m   Physical Exam Vitals reviewed.  Constitutional:      General: She is not in acute distress.    Appearance: She is not diaphoretic.  HENT:     Head: Normocephalic and atraumatic.     Nose: Nose normal.     Mouth/Throat:     Mouth: Mucous membranes are moist.  Eyes:     General: No scleral icterus.    Extraocular Movements: Extraocular movements intact.      Pupils: Pupils are equal, round, and reactive to light.  Cardiovascular:     Rate and Rhythm: Normal rate and regular rhythm.     Pulses: Normal pulses.     Heart sounds: No murmur heard.   Pulmonary:     Breath sounds: Normal breath sounds. No wheezing or rales.  Abdominal:     Palpations: Abdomen is soft.     Tenderness: There is no abdominal tenderness.  Musculoskeletal:        General: Swelling (Right ankle, lateral side with warmth, no erythema) present.     Cervical back: Neck supple. No tenderness.  Skin:  General: Skin is warm.     Findings: No rash.  Neurological:     General: No focal deficit present.     Mental Status: She is alert and oriented to person, place, and time.  Psychiatric:        Mood and Affect: Mood normal.        Behavior: Behavior normal.      Assessment & Plan:   Encounter to establish care Care established Previous chart reviewed History and medications reviewed with the patient  Ankle swelling, right S/p twisting injury while walking on uneven surface 2 months ago Swelling improved now, although still has pain, swelling and warmth Will check X-ray of the right ankle to r/o fracture, plan to obtain MRI if X-ray negative with persistent symptoms Mobic PRN for pain and swelling   Macromastia S/p breast reduction surgery  Obesity (BMI 30.0-34.9) Diet modification advised Moderate exercise/walking for 30 mins/day for 5 days in a week advised Will encourage in the subsequent visits  Hypothyroidism history Will obtain TSH and T4 Currently not on any thyroid supplementation  Advised to get COVID vaccine as soon as possible. Denies flu vaccine despite explaining benefits.  Screening Mammography scheduled.  Provided Ob./Gyn. referral for PAP testing.  Provided GI referral for screening colonoscopy.  Outpatient Encounter Medications as of 10/18/2019  Medication Sig  . meloxicam (MOBIC) 15 MG tablet Take 1 tablet (15 mg total) by  mouth daily as needed for pain.  . [DISCONTINUED] ibuprofen (ADVIL) 600 MG tablet Take 1 tablet (600 mg total) by mouth every 6 (six) hours as needed.  . [DISCONTINUED] meloxicam (MOBIC) 15 MG tablet Take 15 mg by mouth daily.  . [DISCONTINUED] methocarbamol (ROBAXIN-750) 750 MG tablet Take 1 tablet (750 mg total) by mouth 4 (four) times daily.   No facility-administered encounter medications on file as of 10/18/2019.    Follow-up: Return in about 3 weeks (around 11/08/2019).   [name], MD

## 2019-10-18 NOTE — Assessment & Plan Note (Signed)
S/p breast reduction surgery

## 2019-10-18 NOTE — Assessment & Plan Note (Signed)
Care established Previous chart reviewed History and medications reviewed with the patient 

## 2019-10-18 NOTE — Assessment & Plan Note (Signed)
Diet modification advised Moderate exercise/walking for 30 mins/day for 5 days in a week advised Will encourage in the subsequent visits

## 2019-10-18 NOTE — Patient Instructions (Signed)
Please get x-ray of the ankle done as scheduled.  You are encouraged to perform activities including walking as tolerated.  Please take Mobic as needed for pain and inflammation.  You are scheduled for screening mammography.  Please follow-up with OB/GYN for Pap testing.  Please follow-up with gastroenterology for screening colonoscopy.

## 2019-10-21 ENCOUNTER — Ambulatory Visit
Admission: RE | Admit: 2019-10-21 | Discharge: 2019-10-21 | Disposition: A | Discharge status: Home / Self Care | Payer: BC Managed Care – PPO | Source: Ambulatory Visit | Attending: Internal Medicine | Admitting: Internal Medicine

## 2019-10-21 ENCOUNTER — Other Ambulatory Visit: Payer: Self-pay

## 2019-10-21 DIAGNOSIS — Z1231 Encounter for screening mammogram for malignant neoplasm of breast: Secondary | ICD-10-CM

## 2019-10-24 ENCOUNTER — Encounter (INDEPENDENT_AMBULATORY_CARE_PROVIDER_SITE_OTHER): Payer: Self-pay

## 2019-10-26 ENCOUNTER — Other Ambulatory Visit: Payer: Self-pay | Admitting: Internal Medicine

## 2019-10-26 DIAGNOSIS — R928 Other abnormal and inconclusive findings on diagnostic imaging of breast: Secondary | ICD-10-CM

## 2019-11-08 ENCOUNTER — Ambulatory Visit: Payer: BC Managed Care – PPO | Admitting: Internal Medicine

## 2019-11-14 ENCOUNTER — Ambulatory Visit
Admission: RE | Admit: 2019-11-14 | Discharge: 2019-11-14 | Disposition: A | Discharge status: Home / Self Care | Payer: BC Managed Care – PPO | Source: Ambulatory Visit | Attending: Internal Medicine | Admitting: Internal Medicine

## 2019-11-14 ENCOUNTER — Other Ambulatory Visit: Payer: Self-pay

## 2019-11-14 DIAGNOSIS — N6489 Other specified disorders of breast: Secondary | ICD-10-CM | POA: Diagnosis not present

## 2019-11-14 DIAGNOSIS — R928 Other abnormal and inconclusive findings on diagnostic imaging of breast: Secondary | ICD-10-CM

## 2019-11-14 DIAGNOSIS — R922 Inconclusive mammogram: Secondary | ICD-10-CM | POA: Diagnosis not present

## 2019-11-23 ENCOUNTER — Encounter: Payer: BC Managed Care – PPO | Admitting: Adult Health

## 2019-12-06 ENCOUNTER — Encounter: Payer: Self-pay | Admitting: Obstetrics & Gynecology

## 2019-12-06 ENCOUNTER — Encounter: Payer: Self-pay | Admitting: Internal Medicine

## 2019-12-06 ENCOUNTER — Ambulatory Visit (INDEPENDENT_AMBULATORY_CARE_PROVIDER_SITE_OTHER): Payer: BC Managed Care – PPO | Admitting: Internal Medicine

## 2019-12-06 ENCOUNTER — Ambulatory Visit (INDEPENDENT_AMBULATORY_CARE_PROVIDER_SITE_OTHER): Payer: BC Managed Care – PPO | Admitting: Obstetrics & Gynecology

## 2019-12-06 ENCOUNTER — Other Ambulatory Visit (HOSPITAL_COMMUNITY)
Admission: RE | Admit: 2019-12-06 | Discharge: 2019-12-06 | Disposition: A | Discharge status: Home / Self Care | Payer: BC Managed Care – PPO | Source: Ambulatory Visit | Attending: Obstetrics & Gynecology | Admitting: Obstetrics & Gynecology

## 2019-12-06 ENCOUNTER — Other Ambulatory Visit: Payer: Self-pay

## 2019-12-06 VITALS — BP 121/78 | HR 67 | Temp 98.1°F | Resp 18 | Ht 66.0 in | Wt 191.1 lb

## 2019-12-06 VITALS — BP 118/71 | HR 66 | Ht 66.0 in | Wt 191.0 lb

## 2019-12-06 DIAGNOSIS — Z01419 Encounter for gynecological examination (general) (routine) without abnormal findings: Secondary | ICD-10-CM | POA: Diagnosis not present

## 2019-12-06 DIAGNOSIS — M25471 Effusion, right ankle: Secondary | ICD-10-CM

## 2019-12-06 DIAGNOSIS — R7303 Prediabetes: Secondary | ICD-10-CM | POA: Diagnosis not present

## 2019-12-06 DIAGNOSIS — Z7689 Persons encountering health services in other specified circumstances: Secondary | ICD-10-CM | POA: Diagnosis not present

## 2019-12-06 DIAGNOSIS — E669 Obesity, unspecified: Secondary | ICD-10-CM | POA: Diagnosis not present

## 2019-12-06 DIAGNOSIS — E039 Hypothyroidism, unspecified: Secondary | ICD-10-CM | POA: Diagnosis not present

## 2019-12-06 NOTE — Assessment & Plan Note (Signed)
Diet modification advised Moderate exercise/walking for 30 mins/day for 5 days in a week advised Will encourage in the subsequent visits

## 2019-12-06 NOTE — Assessment & Plan Note (Signed)
Likely ligament sprain X-ray revealed soft tissue swelling Swelling better with no pain now Mobic PRN Local heating pads and/or pain relieving cream.

## 2019-12-06 NOTE — Assessment & Plan Note (Signed)
Check CMP and HbA1C Advised to follow low carbohydrate diet

## 2019-12-06 NOTE — Progress Notes (Signed)
Established Patient Office Visit  Subjective:  Patient ID: Erin Forbes, female    DOB: [birth year]  Age: 49 y.o. MRN: [id number]  CC:  Chief Complaint  Patient presents with  . Follow-up    3 week follow up everything is good right now     HPI Erin Forbes is a 49 year old female with PMH of seasonal allergies and breast surgery for macromastia presents for follow up of her ankle pain.  Ankle pain and swelling have been better now, does not require pain medication now. Patient is able to walk without discomfort now. She has mild swelling and hears popping sound sometimes upon movement at the ankle joint. X-ray of the ankle showed soft tissue swelling.  She has not had blood tests yet.  Mammography was reviewed and discussed with the patient.  Patient received first dose of COVID vaccine on 10/28.  Past Medical History:  Diagnosis Date  . Headache 2000    Past Surgical History:  Procedure Laterality Date  . BREAST REDUCTION SURGERY Bilateral 10/30/2014   Procedure: BREAST REDUCTION WITH LIPOSUCTION;  Surgeon: [name], MD;  Location: [facility];  Service: Plastics;  Laterality: Bilateral;  . BREAST SURGERY N/A    Phreesia 10/18/2019  . [place], 2002   two  . CESAREAN SECTION N/A    Phreesia 10/18/2019  . CHOLECYSTECTOMY    . MASS EXCISION Bilateral 10/30/2014   Procedure: BILATERAL EXCISION ACCESSORY BREAST TISSUE;  Surgeon: [name], MD;  Location: [facility];  Service: Plastics;  Laterality: Bilateral;  . REDUCTION MAMMAPLASTY Bilateral     Family History  Problem Relation Age of Onset  . Stroke Mother 68  . Hypertension Mother   . Hypertension Father   . Hypertension Sister   . Hypertension Brother   . Breast cancer Paternal Grandmother     Social History   Socioeconomic History  . Marital status: Single    Spouse name: Not on file  . Number of children: Not on file  . Years of education:  Not on file  . Highest education level: Not on file  Occupational History    Employer: [employer]  Tobacco Use  . Smoking status: Former Smoker    Types: Cigarettes  . Smokeless tobacco: Never Used  . Tobacco comment: Quit 19 years ago, occasionally used to smoke 1 cigarette, smoked for about 2 years  Substance and Sexual Activity  . Alcohol use: Yes    Comment: occ  . Drug use: No  . Sexual activity: Not on file  Other Topics Concern  . Not on file  Social History Narrative  . Not on file   Social Determinants of Health   Financial Resource Strain:   . Difficulty of Paying Living Expenses: Not on file  Food Insecurity:   . Worried About Charity fundraiser in the Last Year: Not on file  . Ran Out of Food in the Last Year: Not on file  Transportation Needs:   . Lack of Transportation (Medical): Not on file  . Lack of Transportation (Non-Medical): Not on file  Physical Activity:   . Days of Exercise per Week: Not on file  . Minutes of Exercise per Session: Not on file  Stress:   . Feeling of Stress : Not on file  Social Connections:   . Frequency of Communication with Friends and Family: Not on file  . Frequency of Social Gatherings with Friends and Family: Not on  file  . Attends Religious Services: Not on file  . Active Member of Clubs or Organizations: Not on file  . Attends Archivist Meetings: Not on file  . Marital Status: Not on file  Intimate Partner Violence:   . Fear of Current or Ex-Partner: Not on file  . Emotionally Abused: Not on file  . Physically Abused: Not on file  . Sexually Abused: Not on file    Outpatient Medications Prior to Visit  Medication Sig Dispense Refill  . meloxicam (MOBIC) 15 MG tablet Take 1 tablet (15 mg total) by mouth daily as needed for pain. (Patient not taking: Reported on 12/06/2019) 30 tablet 1   No facility-administered medications prior to visit.    Allergies  Allergen Reactions  . Prednisone Rash     ROS Review of Systems  Constitutional: Negative for chills and fever.  HENT: Negative for congestion, sinus pressure, sinus pain and sore throat.   Eyes: Negative for pain and discharge.  Respiratory: Negative for cough and shortness of breath.   Cardiovascular: Negative for chest pain and palpitations.  Gastrointestinal: Negative for abdominal pain, constipation, diarrhea, nausea and vomiting.  Endocrine: Negative for polydipsia and polyuria.  Genitourinary: Negative for dysuria and hematuria.  Musculoskeletal: Negative for neck pain and neck stiffness.       Right ankle swelling, better now  Skin: Negative for rash.  Neurological: Negative for dizziness and weakness.  Psychiatric/Behavioral: Negative for agitation and behavioral problems.      Objective:    Physical Exam Vitals reviewed.  Constitutional:      General: She is not in acute distress.    Appearance: She is not diaphoretic.  HENT:     Head: Normocephalic and atraumatic.     Nose: Nose normal. No congestion.     Mouth/Throat:     Mouth: Mucous membranes are moist.     Pharynx: No posterior oropharyngeal erythema.  Eyes:     General: No scleral icterus.    Extraocular Movements: Extraocular movements intact.     Pupils: Pupils are equal, round, and reactive to light.  Cardiovascular:     Rate and Rhythm: Normal rate and regular rhythm.     Pulses: Normal pulses.     Heart sounds: Normal heart sounds. No murmur heard.   Pulmonary:     Breath sounds: Normal breath sounds. No wheezing or rales.  Abdominal:     Palpations: Abdomen is soft.     Tenderness: There is no abdominal tenderness.  Musculoskeletal:        General: Swelling (Mild over right ankle, lateral side) present.     Cervical back: Neck supple. No tenderness.     Right lower leg: No edema.     Left lower leg: No edema.  Skin:    General: Skin is warm.     Findings: No rash.  Neurological:     General: No focal deficit present.      Mental Status: She is alert and oriented to person, place, and time.     Sensory: No sensory deficit.     Motor: No weakness.  Psychiatric:        Mood and Affect: Mood normal.        Behavior: Behavior normal.     BP 121/78 (BP Location: Right Arm, Patient Position: Sitting, Cuff Size: Normal)   Pulse 67   Temp 98.1 F (36.7 C) (Oral)   Resp 18   Ht 5\' 6"  (1.676 m)   Wt  191 lb 1.9 oz (86.7 kg)   SpO2 99%   BMI 30.85 kg/m  Wt Readings from Last 3 Encounters:  12/06/19 191 lb 1.9 oz (86.7 kg)  10/18/19 187 lb 6.4 oz (85 kg)  08/28/18 192 lb (87.1 kg)     Health Maintenance Due  Topic Date Due  . Hepatitis C Screening  Never done  . PAP SMEAR-Modifier  04/26/2018  . TETANUS/TDAP  07/04/2018    There are no preventive care reminders to display for this patient.  Lab Results  Component Value Date   TSH 3.87 04/26/2015   Lab Results  Component Value Date   WBC 7.3 08/28/2018   HGB 11.8 (L) 08/28/2018   HCT 37.7 08/28/2018   MCV 85.1 08/28/2018   PLT 235 08/28/2018   Lab Results  Component Value Date   NA 139 08/28/2018   K 3.4 (L) 08/28/2018   CO2 27 08/28/2018   GLUCOSE 109 (H) 08/28/2018   BUN 8 08/28/2018   CREATININE 0.84 08/28/2018   BILITOT 0.5 04/26/2015   ALKPHOS 86 04/26/2015   AST 14 04/26/2015   ALT 12 04/26/2015   PROT 7.2 04/26/2015   ALBUMIN 4.2 04/26/2015   CALCIUM 8.9 08/28/2018   ANIONGAP 5 08/28/2018   Lab Results  Component Value Date   CHOL 97 (L) 04/26/2015   Lab Results  Component Value Date   HDL 34 (L) 04/26/2015   Lab Results  Component Value Date   LDLCALC 40 04/26/2015   Lab Results  Component Value Date   TRIG 114 04/26/2015   Lab Results  Component Value Date   CHOLHDL 2.9 04/26/2015   Lab Results  Component Value Date   HGBA1C 5.8 (H) 04/26/2015      Assessment & Plan:   Problem List Items Addressed This Visit      Other   Obesity (BMI 30.0-34.9)    Diet modification advised Moderate  exercise/walking for 30 mins/day for 5 days in a week advised Will encourage in the subsequent visits      Prediabetes    Check CMP and HbA1C Advised to follow low carbohydrate diet      Ankle swelling, right - Primary    Likely ligament sprain X-ray revealed soft tissue swelling Swelling better with no pain now Mobic PRN Local heating pads and/or pain relieving cream.         No orders of the defined types were placed in this encounter.   Follow-up: Return in about 6 months (around 06/04/2020), or if symptoms worsen or fail to improve.    [name], MD

## 2019-12-06 NOTE — Addendum Note (Signed)
Addended by: Janece Canterbury on: 12/06/2019 01:51 PM   Modules accepted: Orders

## 2019-12-06 NOTE — Patient Instructions (Addendum)
Please continue to use right ankle as tolerated. Apply heating pad and/or pain relieving cream (Bengay/IcyHot) locally to help with pain/swelling.  If you have worsening of pain, please contact us.  You are advised to take [facility] Multivitamin.   Please follow up with OB/GYN and GI as scheduled.

## 2019-12-06 NOTE — Progress Notes (Signed)
Subjective:     Erin Forbes is a 49 y.o. female here for a routine exam.  Patient's last menstrual period was 07/03/2019. [id number] Birth Control Method:  Tubal ligation Menstrual Calendar(currently): amenorrheic since June  Current complaints: none.   Current acute medical issues:  none   Recent Gynecologic History Patient's last menstrual period was 07/03/2019. Last Pap: 2017,  normal Last mammogram: 2021,  normal  Past Medical History:  Diagnosis Date  . Headache 2000    Past Surgical History:  Procedure Laterality Date  . BREAST REDUCTION SURGERY Bilateral 10/30/2014   Procedure: BREAST REDUCTION WITH LIPOSUCTION;  Surgeon: [name], MD;  Location: [facility];  Service: Plastics;  Laterality: Bilateral;  . BREAST SURGERY N/A    Phreesia 10/18/2019  . [place], 2002   two  . CESAREAN SECTION N/A    Phreesia 10/18/2019  . CHOLECYSTECTOMY    . MASS EXCISION Bilateral 10/30/2014   Procedure: BILATERAL EXCISION ACCESSORY BREAST TISSUE;  Surgeon: [name], MD;  Location: [facility];  Service: Plastics;  Laterality: Bilateral;  . REDUCTION MAMMAPLASTY Bilateral     OB History    Gravida  3   Para  2   Term  2   Preterm      AB  1   Living  2     SAB  1   TAB      Ectopic      Multiple      Live Births              Social History   Socioeconomic History  . Marital status: Single    Spouse name: Not on file  . Number of children: Not on file  . Years of education: Not on file  . Highest education level: Not on file  Occupational History    Employer: [employer]  Tobacco Use  . Smoking status: Former Smoker    Types: Cigarettes  . Smokeless tobacco: Never Used  . Tobacco comment: Quit 19 years ago, occasionally used to smoke 1 cigarette, smoked for about 2 years  Vaping Use  . Vaping Use: Never used  Substance and Sexual Activity  . Alcohol use: Yes    Comment: occ  . Drug  use: No  . Sexual activity: Not Currently    Birth control/protection: None  Other Topics Concern  . Not on file  Social History Narrative  . Not on file   Social Determinants of Health   Financial Resource Strain: Medium Risk  . Difficulty of Paying Living Expenses: Somewhat hard  Food Insecurity: Food Insecurity Present  . Worried About Charity fundraiser in the Last Year: Sometimes true  . Ran Out of Food in the Last Year: Never true  Transportation Needs: No Transportation Needs  . Lack of Transportation (Medical): No  . Lack of Transportation (Non-Medical): No  Physical Activity: Insufficiently Active  . Days of Exercise per Week: 2 days  . Minutes of Exercise per Session: 20 min  Stress: No Stress Concern Present  . Feeling of Stress : Only a little  Social Connections: Unknown  . Frequency of Communication with Friends and Family: Three times a week  . Frequency of Social Gatherings with Friends and Family: More than three times a week  . Attends Religious Services: Patient refused  . Active Member of Clubs or Organizations: Yes  . Attends Archivist Meetings: 1 to 4 times per year  .  Marital Status: Never married    Family History  Problem Relation Age of Onset  . Stroke Mother 66  . Hypertension Mother   . Hypertension Father   . Hypertension Sister   . Hypertension Brother   . Breast cancer Paternal Grandmother     No current outpatient medications on file.  Review of Systems  Review of Systems  Constitutional: Negative for fever, chills, weight loss, malaise/fatigue and diaphoresis.  HENT: Negative for hearing loss, ear pain, nosebleeds, congestion, sore throat, neck pain, tinnitus and ear discharge.   Eyes: Negative for blurred vision, double vision, photophobia, pain, discharge and redness.  Respiratory: Negative for cough, hemoptysis, sputum production, shortness of breath, wheezing and stridor.   Cardiovascular: Negative for chest pain,  palpitations, orthopnea, claudication, leg swelling and PND.  Gastrointestinal: negative for abdominal pain. Negative for heartburn, nausea, vomiting, diarrhea, constipation, blood in stool and melena.  Genitourinary: Negative for dysuria, urgency, frequency, hematuria and flank pain.  Musculoskeletal: Negative for myalgias, back pain, joint pain and falls.  Skin: Negative for itching and rash.  Neurological: Negative for dizziness, tingling, tremors, sensory change, speech change, focal weakness, seizures, loss of consciousness, weakness and headaches.  Endo/Heme/Allergies: Negative for environmental allergies and polydipsia. Does not bruise/bleed easily.  Psychiatric/Behavioral: Negative for depression, suicidal ideas, hallucinations, memory loss and substance abuse. The patient is not nervous/anxious and does not have insomnia.        Objective:  Blood pressure 118/71, pulse 66, height 5\' 6"  (1.676 m), weight 191 lb (86.6 kg), last menstrual period 07/03/2019.   Physical Exam  Vitals reviewed. Constitutional: She is oriented to person, place, and time. She appears well-developed and well-nourished.  HENT:  Head: Normocephalic and atraumatic.        Right Ear: External ear normal.  Left Ear: External ear normal.  Nose: Nose normal.  Mouth/Throat: Oropharynx is clear and moist.  Eyes: Conjunctivae and EOM are normal. Pupils are equal, round, and reactive to light. Right eye exhibits no discharge. Left eye exhibits no discharge. No scleral icterus.  Neck: Normal range of motion. Neck supple. No tracheal deviation present. No thyromegaly present.  Cardiovascular: Normal rate, regular rhythm, normal heart sounds and intact distal pulses.  Exam reveals no gallop and no friction rub.   No murmur heard. Respiratory: Effort normal and breath sounds normal. No respiratory distress. She has no wheezes. She has no rales. She exhibits no tenderness.  GI: Soft. Bowel sounds are normal. She exhibits  no distension and no mass. There is no tenderness. There is no rebound and no guarding.  Genitourinary:  Breasts no masses skin changes or nipple changes bilaterally      Vulva is normal without lesions Vagina is pink moist without discharge Cervix normal in appearance and pap is done Uterus is normal size shape and contour Adnexa is negative with normal sized ovaries   Musculoskeletal: Normal range of motion. She exhibits no edema and no tenderness.  Neurological: She is alert and oriented to person, place, and time. She has normal reflexes. She displays normal reflexes. No cranial nerve deficit. She exhibits normal muscle tone. Coordination normal.  Skin: Skin is warm and dry. No rash noted. No erythema. No pallor.  Psychiatric: She has a normal mood and affect. Her behavior is normal. Judgment and thought content normal.       Medications Ordered at today's visit: No orders of the defined types were placed in this encounter.   Other orders placed at today's visit: No orders  of the defined types were placed in this encounter.     Assessment:    Normal Gyn exam.    Plan:    Contraception: none. Mammogram ordered. Follow up in: 3 years.     Return in about 3 years (around 12/06/2022) for yearly.

## 2019-12-07 LAB — CMP14+EGFR
ALT: 7 IU/L (ref 0–32)
AST: 12 IU/L (ref 0–40)
Albumin/Globulin Ratio: 1.8 (ref 1.2–2.2)
Albumin: 4.2 g/dL (ref 3.8–4.8)
Alkaline Phosphatase: 102 IU/L (ref 44–121)
BUN/Creatinine Ratio: 14 (ref 9–23)
BUN: 11 mg/dL (ref 6–24)
Bilirubin Total: 0.3 mg/dL (ref 0.0–1.2)
CO2: 24 mmol/L (ref 20–29)
Calcium: 9.3 mg/dL (ref 8.7–10.2)
Chloride: 106 mmol/L (ref 96–106)
Creatinine, Ser: 0.81 mg/dL (ref 0.57–1.00)
GFR calc Af Amer: 99 mL/min/{1.73_m2} (ref >59)
GFR calc non Af Amer: 86 mL/min/{1.73_m2} (ref >59)
Globulin, Total: 2.4 g/dL (ref 1.5–4.5)
Glucose: 91 mg/dL (ref 65–99)
Potassium: 4.5 mmol/L (ref 3.5–5.2)
Sodium: 142 mmol/L (ref 134–144)
Total Protein: 6.6 g/dL (ref 6.0–8.5)

## 2019-12-09 ENCOUNTER — Telehealth: Payer: Self-pay

## 2019-12-09 LAB — CYTOLOGY - PAP
Chlamydia: NEGATIVE
Comment: NEGATIVE
Comment: NEGATIVE
Comment: NORMAL
Diagnosis: NEGATIVE
High risk HPV: NEGATIVE
Neisseria Gonorrhea: NEGATIVE

## 2019-12-09 NOTE — Telephone Encounter (Signed)
Pt is wanting levothoroxine called in

## 2019-12-09 NOTE — Telephone Encounter (Signed)
Pt is needing this medication but it is not on her med list would you like to fill it ?

## 2019-12-09 NOTE — Telephone Encounter (Signed)
LVM for pt to call the office to schedule phone visit for this medication

## 2019-12-09 NOTE — Telephone Encounter (Signed)
Please schedule a phone visit with me in next week if possible.

## 2019-12-13 LAB — LIPID PANEL
Chol/HDL Ratio: 2.7 ratio (ref 0.0–4.4)
Cholesterol, Total: 118 mg/dL (ref 100–199)
HDL: 44 mg/dL (ref >39)
LDL Chol Calc (NIH): 58 mg/dL (ref 0–99)
Triglycerides: 77 mg/dL (ref 0–149)
VLDL Cholesterol Cal: 16 mg/dL (ref 5–40)

## 2019-12-13 LAB — CBC WITH DIFFERENTIAL/PLATELET
Basophils Absolute: 0.1 10*3/uL (ref 0.0–0.2)
Basos: 1 %
EOS (ABSOLUTE): 0.1 10*3/uL (ref 0.0–0.4)
Eos: 2 %
Hematocrit: 37.6 % (ref 34.0–46.6)
Hemoglobin: 12.2 g/dL (ref 11.1–15.9)
Immature Grans (Abs): 0 10*3/uL (ref 0.0–0.1)
Immature Granulocytes: 0 %
Lymphocytes Absolute: 2.5 10*3/uL (ref 0.7–3.1)
Lymphs: 37 %
MCH: 27 pg (ref 26.6–33.0)
MCHC: 32.4 g/dL (ref 31.5–35.7)
MCV: 83 fL (ref 79–97)
Monocytes Absolute: 0.5 10*3/uL (ref 0.1–0.9)
Monocytes: 8 %
Neutrophils Absolute: 3.6 10*3/uL (ref 1.4–7.0)
Neutrophils: 52 %
Platelets: 255 10*3/uL (ref 150–450)
RBC: 4.52 x10E6/uL (ref 3.77–5.28)
RDW: 14.5 % (ref 11.7–15.4)
WBC: 6.7 10*3/uL (ref 3.4–10.8)

## 2019-12-13 LAB — VITAMIN D 1,25 DIHYDROXY
Vitamin D 1, 25 (OH)2 Total: 58 pg/mL
Vitamin D2 1, 25 (OH)2: 25 pg/mL
Vitamin D3 1, 25 (OH)2: 33 pg/mL

## 2019-12-13 LAB — T4 AND TSH
T4, Total: 5.4 ug/dL (ref 4.5–12.0)
TSH: 7.51 u[IU]/mL — ABNORMAL HIGH (ref 0.450–4.500)

## 2019-12-13 LAB — HEMOGLOBIN A1C
Est. average glucose Bld gHb Est-mCnc: 120 mg/dL
Hgb A1c MFr Bld: 5.8 % — ABNORMAL HIGH (ref 4.8–5.6)

## 2019-12-14 LAB — SPECIMEN STATUS REPORT

## 2019-12-14 LAB — THYROID PEROXIDASE ANTIBODY: Thyroperoxidase Ab SerPl-aCnc: <8 IU/mL (ref 0–34)

## 2019-12-20 NOTE — Telephone Encounter (Signed)
Pt made an appt 12-20-19

## 2019-12-21 ENCOUNTER — Encounter: Payer: Self-pay | Admitting: Internal Medicine

## 2019-12-21 ENCOUNTER — Telehealth (INDEPENDENT_AMBULATORY_CARE_PROVIDER_SITE_OTHER): Payer: BC Managed Care – PPO | Admitting: Internal Medicine

## 2019-12-21 ENCOUNTER — Other Ambulatory Visit: Payer: Self-pay

## 2019-12-21 DIAGNOSIS — E039 Hypothyroidism, unspecified: Secondary | ICD-10-CM | POA: Diagnosis not present

## 2019-12-21 DIAGNOSIS — R7303 Prediabetes: Secondary | ICD-10-CM

## 2019-12-21 MED ORDER — LEVOTHYROXINE SODIUM 25 MCG PO TABS: 25.0000 ug | ORAL_TABLET | Freq: Every day | ORAL | 0 refills | Status: DC

## 2019-12-21 NOTE — Assessment & Plan Note (Signed)
Advised to follow low carbohydrate diet Check HbA1C in the next visit

## 2019-12-21 NOTE — Patient Instructions (Addendum)
Please start taking Levothyroxine as prescribed.  Please follow up after 3 months for repeat testing of thyroid function and HbA1C.  Please follow low carbohydrate diet for your prediabetes.

## 2019-12-21 NOTE — Assessment & Plan Note (Signed)
Lab Results  Component Value Date   TSH 7.510 (H) 12/06/2019   Started Levothyroxine 25 mcg QD Repeat TSH and free T4 in the next visit Patient reports noncompliance to treatment in the past, advised for compliance.

## 2019-12-21 NOTE — Progress Notes (Signed)
Virtual Visit via Telephone Note   This visit type was conducted due to national recommendations for restrictions regarding the COVID-19 Pandemic (e.g. social distancing) in an effort to limit this patient's exposure and mitigate transmission in our community.  Due to her co-morbid illnesses, this patient is at least at moderate risk for complications without adequate follow up.  This format is felt to be most appropriate for this patient at this time.  The patient did not have access to video technology/had technical difficulties with video requiring transitioning to audio format only (telephone).  All issues noted in this document were discussed and addressed.  No physical exam could be performed with this format.  Evaluation Performed:  Follow-up visit  Date:  12/21/2019   ID:  Erin Forbes, DOB [birth year], MRN [id number]  Participants: Patient Patient Location: Home Provider Location: Office/Clinic  Location of Patient: Home Location of Provider: Telehealth Consent was obtain for visit to be over via telehealth. I verified that I am speaking with the correct person using two identifiers.  PCP:  [name], MD   Chief Complaint:  Weight gain  History of Present Illness:    Erin Forbes is a 49 y.o. female with PMH of seasonal allergies and breast surgery for macromastia who has a televisit for refill of her thyroid medication.  Patient states that she was prescribed thyroid medication in the past, which she stopped taking around 02/2018. She has been having about 10 lbs. Weight gain in last 1 month and also c/o constipation, dry skin and palpitations at times. She does not remember her thyroid medication detail. Thyroid function tests were discussed in detail with the patient. Of note, patient had not mentioned about thyroid problem in the previous visit.  The patient does not have symptoms concerning for COVID-19 infection (fever, chills, cough, or new shortness of  breath).   Past Medical, Surgical, Social History, Allergies, and Medications have been Reviewed.  Past Medical History:  Diagnosis Date  . Headache 2000   Past Surgical History:  Procedure Laterality Date  . BREAST REDUCTION SURGERY Bilateral 10/30/2014   Procedure: BREAST REDUCTION WITH LIPOSUCTION;  Surgeon: [name], MD;  Location: [facility];  Service: Plastics;  Laterality: Bilateral;  . BREAST SURGERY N/A    Phreesia 10/18/2019  . [place], 2002   two  . CESAREAN SECTION N/A    Phreesia 10/18/2019  . CHOLECYSTECTOMY    . MASS EXCISION Bilateral 10/30/2014   Procedure: BILATERAL EXCISION ACCESSORY BREAST TISSUE;  Surgeon: [name], MD;  Location: [place];  Service: Plastics;  Laterality: Bilateral;  . REDUCTION MAMMAPLASTY Bilateral      No outpatient medications have been marked as taking for the 12/21/19 encounter (Video Visit) with [name], MD.     Allergies:   Prednisone   ROS:   Review of Systems  Constitutional: Positive for malaise/fatigue. Negative for weight loss.  HENT: Negative for congestion, ear discharge, ear pain and sore throat.   Eyes: Negative for pain and redness.  Respiratory: Negative for cough and shortness of breath.   Cardiovascular: Negative for chest pain and leg swelling.  Gastrointestinal: Positive for constipation. Negative for diarrhea, nausea and vomiting.  Genitourinary: Negative for dysuria and hematuria.  Musculoskeletal: Negative for back pain and neck pain.  Skin: Negative for rash.  Neurological: Negative for dizziness, focal weakness, seizures and headaches.  Psychiatric/Behavioral: Negative for suicidal ideas. The patient is not nervous/anxious.  Labs/Other Tests and Data Reviewed:    Recent Labs: 12/06/2019: ALT 7; BUN 11; Creatinine, Ser 0.81; Hemoglobin 12.2; Platelets 255; Potassium 4.5; Sodium 142; TSH 7.510   Recent Lipid Panel Lab Results    Component Value Date/Time   CHOL 118 12/06/2019 08:41 AM   TRIG 77 12/06/2019 08:41 AM   HDL 44 12/06/2019 08:41 AM   CHOLHDL 2.7 12/06/2019 08:41 AM   CHOLHDL 2.9 04/26/2015 10:02 AM   LDLCALC 58 12/06/2019 08:41 AM    Wt Readings from Last 3 Encounters:  12/06/19 191 lb (86.6 kg)  12/06/19 191 lb 1.9 oz (86.7 kg)  10/18/19 187 lb 6.4 oz (85 kg)     ASSESSMENT & PLAN:    Hypothyroidism Lab Results  Component Value Date   TSH 7.510 (H) 12/06/2019   Started Levothyroxine 25 mcg QD Repeat TSH and free T4 in the next visit Patient reports noncompliance to treatment in the past, advised for compliance.  Prediabetes Advised to follow low carbohydrate diet Check HbA1C in the next visit    Time:   Today, I have spent 22 minutes reviewing the chart, including problem list, medications, and with the patient with telehealth technology discussing the above problems.   Medication Adjustments/Labs and Tests Ordered: Current medicines are reviewed at length with the patient today.  Concerns regarding medicines are outlined above.   Tests Ordered: No orders of the defined types were placed in this encounter.   Medication Changes: Meds ordered this encounter  Medications  . levothyroxine (SYNTHROID) 25 MCG tablet    Sig: Take 1 tablet (25 mcg total) by mouth daily before breakfast.    Dispense:  90 tablet    Refill:  0     Note: This dictation was prepared with Dragon dictation along with smaller phrase technology. Similar sounding words can be transcribed inadequately or may not be corrected upon review. Any transcriptional errors that result from this process are unintentional.      Disposition:  Follow up  Signed, [name], MD  12/21/2019 9:15 AM     [place]

## 2020-04-06 ENCOUNTER — Other Ambulatory Visit: Payer: Self-pay | Admitting: Internal Medicine

## 2020-04-06 DIAGNOSIS — E039 Hypothyroidism, unspecified: Secondary | ICD-10-CM

## 2020-04-06 MED ORDER — LEVOTHYROXINE SODIUM 25 MCG PO TABS: 25.0000 ug | ORAL_TABLET | Freq: Every day | ORAL | 0 refills | Status: DC

## 2020-06-04 ENCOUNTER — Ambulatory Visit: Payer: BC Managed Care – PPO | Admitting: Internal Medicine

## 2020-06-05 ENCOUNTER — Encounter: Payer: Self-pay | Admitting: Internal Medicine

## 2020-06-05 ENCOUNTER — Other Ambulatory Visit: Payer: Self-pay

## 2020-06-05 ENCOUNTER — Ambulatory Visit (INDEPENDENT_AMBULATORY_CARE_PROVIDER_SITE_OTHER): Payer: BC Managed Care – PPO | Admitting: Internal Medicine

## 2020-06-05 ENCOUNTER — Encounter (INDEPENDENT_AMBULATORY_CARE_PROVIDER_SITE_OTHER): Payer: Self-pay | Admitting: *Deleted

## 2020-06-05 VITALS — BP 138/89 | HR 105 | Temp 98.2°F | Resp 18 | Ht 66.0 in | Wt 186.8 lb

## 2020-06-05 DIAGNOSIS — Z1159 Encounter for screening for other viral diseases: Secondary | ICD-10-CM

## 2020-06-05 DIAGNOSIS — L509 Urticaria, unspecified: Secondary | ICD-10-CM | POA: Diagnosis not present

## 2020-06-05 DIAGNOSIS — Z1211 Encounter for screening for malignant neoplasm of colon: Secondary | ICD-10-CM | POA: Diagnosis not present

## 2020-06-05 DIAGNOSIS — K5900 Constipation, unspecified: Secondary | ICD-10-CM | POA: Diagnosis not present

## 2020-06-05 DIAGNOSIS — E039 Hypothyroidism, unspecified: Secondary | ICD-10-CM

## 2020-06-05 NOTE — Progress Notes (Signed)
Established Patient Office Visit  Subjective:  Patient ID: Erin Forbes, female    DOB: Dec 22, 1970  Age: 50 y.o. MRN: [id number]  CC:  Chief Complaint  Patient presents with  . Follow-up    6 month follow up pt thinks she has ibs sometimes she has to go sometimes she is constipated sometimes it is loose this has been going on for about 2 weeks also pt started breaking out in hives today has on arms legs and back     HPI Erin Forbes is a 50 y.o. female with PMH of hypothyroidism, constipation, seasonal allergies and breast surgery for macromastia who presents for follow up of her chronic medical conditions.  Hypothyroidism: Takes Levothyroxine regularly. Denies any throat pain, palpitations, tremors or recent skin or nail changes.  She has been having constipation on most of the days, with alternating loose BM on some days. Denies any melena or hematochezia.  She also reports having hives over b/l arms and left leg since this morning. Denies any plant related work or outdoor activity today.  Past Medical History:  Diagnosis Date  . Headache 2000    Past Surgical History:  Procedure Laterality Date  . BREAST REDUCTION SURGERY Bilateral 10/30/2014   Procedure: BREAST REDUCTION WITH LIPOSUCTION;  Surgeon: [name], MD;  Location: [facility];  Service: Plastics;  Laterality: Bilateral;  . BREAST SURGERY N/A    Phreesia 10/18/2019  . [place], 2002   two  . CESAREAN SECTION N/A    Phreesia 10/18/2019  . CHOLECYSTECTOMY    . MASS EXCISION Bilateral 10/30/2014   Procedure: BILATERAL EXCISION ACCESSORY BREAST TISSUE;  Surgeon: [name], MD;  Location: [facility];  Service: Plastics;  Laterality: Bilateral;  . REDUCTION MAMMAPLASTY Bilateral     Family History  Problem Relation Age of Onset  . Stroke Mother 50  . Hypertension Mother   . Hypertension Father   . Hypertension Sister   . Hypertension Brother   .  Breast cancer Paternal Grandmother     Social History   Socioeconomic History  . Marital status: Single    Spouse name: Not on file  . Number of children: Not on file  . Years of education: Not on file  . Highest education level: Not on file  Occupational History    Employer: [employer]  Tobacco Use  . Smoking status: Former Smoker    Types: Cigarettes  . Smokeless tobacco: Never Used  . Tobacco comment: Quit 19 years ago, occasionally used to smoke 1 cigarette, smoked for about 2 years  Vaping Use  . Vaping Use: Never used  Substance and Sexual Activity  . Alcohol use: Yes    Comment: occ  . Drug use: No  . Sexual activity: Not Currently    Birth control/protection: None  Other Topics Concern  . Not on file  Social History Narrative  . Not on file   Social Determinants of Health   Financial Resource Strain: Medium Risk  . Difficulty of Paying Living Expenses: Somewhat hard  Food Insecurity: Food Insecurity Present  . Worried About Charity fundraiser in the Last Year: Sometimes true  . Ran Out of Food in the Last Year: Never true  Transportation Needs: No Transportation Needs  . Lack of Transportation (Medical): No  . Lack of Transportation (Non-Medical): No  Physical Activity: Insufficiently Active  . Days of Exercise per Week: 2 days  . Minutes of Exercise per  Session: 20 min  Stress: No Stress Concern Present  . Feeling of Stress : Only a little  Social Connections: Unknown  . Frequency of Communication with Friends and Family: Three times a week  . Frequency of Social Gatherings with Friends and Family: More than three times a week  . Attends Religious Services: Patient refused  . Active Member of Clubs or Organizations: Yes  . Attends Archivist Meetings: 1 to 4 times per year  . Marital Status: Never married  Intimate Partner Violence: Not At Risk  . Fear of Current or Ex-Partner: No  . Emotionally Abused: No  . Physically Abused: No  .  Sexually Abused: No    Outpatient Medications Prior to Visit  Medication Sig Dispense Refill  . levothyroxine (SYNTHROID) 25 MCG tablet Take 1 tablet (25 mcg total) by mouth daily before breakfast. 90 tablet 0   No facility-administered medications prior to visit.    Allergies  Allergen Reactions  . Prednisone Rash    ROS Review of Systems  Constitutional: Negative for chills and fever.  HENT: Negative for congestion, sinus pressure, sinus pain and sore throat.   Eyes: Negative for pain and discharge.  Respiratory: Negative for cough and shortness of breath.   Cardiovascular: Negative for chest pain and palpitations.  Gastrointestinal: Positive for constipation. Negative for abdominal pain, nausea and vomiting.  Endocrine: Negative for polydipsia and polyuria.  Genitourinary: Negative for dysuria and hematuria.  Musculoskeletal: Negative for neck pain and neck stiffness.  Skin: Negative for rash.  Neurological: Negative for dizziness and weakness.  Psychiatric/Behavioral: Negative for agitation and behavioral problems.      Objective:    Physical Exam Vitals reviewed.  Constitutional:      General: She is not in acute distress.    Appearance: She is not diaphoretic.  HENT:     Head: Normocephalic and atraumatic.     Nose: Nose normal. No congestion.     Mouth/Throat:     Mouth: Mucous membranes are moist.     Pharynx: No posterior oropharyngeal erythema.  Eyes:     General: No scleral icterus.    Extraocular Movements: Extraocular movements intact.  Cardiovascular:     Rate and Rhythm: Normal rate and regular rhythm.     Pulses: Normal pulses.     Heart sounds: Normal heart sounds. No murmur heard.   Pulmonary:     Breath sounds: Normal breath sounds. No wheezing or rales.  Musculoskeletal:     Cervical back: Neck supple. No tenderness.     Right lower leg: No edema.     Left lower leg: No edema.  Skin:    General: Skin is warm.     Findings: No rash.   Neurological:     General: No focal deficit present.     Mental Status: She is alert and oriented to person, place, and time.     Sensory: No sensory deficit.     Motor: No weakness.  Psychiatric:        Mood and Affect: Mood normal.        Behavior: Behavior normal.     BP 138/89 (BP Location: Right Arm, Patient Position: Sitting, Cuff Size: Normal)   Pulse (!) 105   Temp 98.2 F (36.8 C) (Oral)   Resp 18   Ht 5\' 6"  (1.676 m)   Wt 186 lb 12.8 oz (84.7 kg)   SpO2 100%   BMI 30.15 kg/m  Wt Readings from Last 3 Encounters:  06/05/20 186 lb 12.8 oz (84.7 kg)  12/06/19 191 lb (86.6 kg)  12/06/19 191 lb 1.9 oz (86.7 kg)     Health Maintenance Due  Topic Date Due  . Hepatitis C Screening  Never done  . COLONOSCOPY (Pts 45-70yrs Insurance coverage will need to be confirmed)  Never done  . TETANUS/TDAP  07/04/2018  . COVID-19 Vaccine (3 - Booster for Moderna series) 05/15/2020    There are no preventive care reminders to display for this patient.  Lab Results  Component Value Date   TSH 7.510 (H) 12/06/2019   Lab Results  Component Value Date   WBC 6.7 12/06/2019   HGB 12.2 12/06/2019   HCT 37.6 12/06/2019   MCV 83 12/06/2019   PLT 255 12/06/2019   Lab Results  Component Value Date   NA 142 12/06/2019   K 4.5 12/06/2019   CO2 24 12/06/2019   GLUCOSE 91 12/06/2019   BUN 11 12/06/2019   CREATININE 0.81 12/06/2019   BILITOT 0.3 12/06/2019   ALKPHOS 102 12/06/2019   AST 12 12/06/2019   ALT 7 12/06/2019   PROT 6.6 12/06/2019   ALBUMIN 4.2 12/06/2019   CALCIUM 9.3 12/06/2019   ANIONGAP 5 08/28/2018   Lab Results  Component Value Date   CHOL 118 12/06/2019   Lab Results  Component Value Date   HDL 44 12/06/2019   Lab Results  Component Value Date   LDLCALC 58 12/06/2019   Lab Results  Component Value Date   TRIG 77 12/06/2019   Lab Results  Component Value Date   CHOLHDL 2.7 12/06/2019   Lab Results  Component Value Date   HGBA1C 5.8 (H)  12/06/2019      Assessment & Plan:   Problem List Items Addressed This Visit      Endocrine   Hypothyroidism - Primary    Lab Results  Component Value Date   TSH 7.510 (H) 12/06/2019   On Levothyroxine 25 mcg QD Check TSH and free T4      Relevant Orders   Basic Metabolic Panel (BMET)   TSH + free T4     Other   Constipation    Colace or senna Patient prefers to take Senna PRN. Also reports occasional alternating diarrhea, could be a sign of IBS. Referred to GI for screening colonoscopy.       Other Visit Diagnoses    Urticaria     Benadryl PRN Hydrocortisone cream for overt skin irritation    Screen for colon cancer       Relevant Orders   Ambulatory referral to Gastroenterology   Need for hepatitis C screening test       Relevant Orders   Hepatitis C Antibody      No orders of the defined types were placed in this encounter.   Follow-up: Return in about 4 months (around 10/06/2020) for Annual physical.    [name], MD

## 2020-06-05 NOTE — Assessment & Plan Note (Signed)
Lab Results  Component Value Date   TSH 7.510 (H) 12/06/2019   On Levothyroxine 25 mcg QD Check TSH and free T4

## 2020-06-05 NOTE — Patient Instructions (Addendum)
Please start taking Colace or Senna for constipation.  Please continue taking Levothyroxine for now.  Please take at least 64 ounces of fluid in a day.  Please take Benadryl or Claritin for hives.  Hives Hives are itchy, red, swollen areas on your skin. Hives can show up on any part of your body. Hives often fade within 24 hours (acute hives). New hives can show up after old ones fade. This can go on for many days or weeks (chronic hives). Hives do not spread from person to person (are not contagious). Hives are caused by your body's response to something that you are allergic to (allergen). These are sometimes called triggers. You can get hives right after being around a trigger, or hours later. What are the causes?  Allergies to foods.  Insect bites or stings.  Pollen.  Pets.  Latex.  Chemicals.  Spending time in sunlight, heat, or cold.  Exercise.  Stress.  Some medicines.  Viruses. This includes the common cold.  Infections caused by germs (bacteria).  Allergy shots.  Blood transfusions. Sometimes, the cause is not known. What increases the risk?  Being a woman.  Being allergic to foods such as: ? Citrus fruits. ? Milk. ? Eggs. ? Peanuts. ? Tree nuts. ? Shellfish.  Being allergic to: ? Medicines. ? Latex. ? Insects. ? Animals. ? Pollen. What are the signs or symptoms?  Raised, itchy, red or white bumps or patches on your skin. These areas may: ? Get large and swollen. ? Change in shape and location. ? Stand alone or connect to each other over a large area of skin. ? Sting or hurt. ? Turn white when pressed in the center (blanch). In very bad cases, your hands, feet, and face may also get swollen. This may happen if hives start deeper in your skin.   How is this treated? Treatment for this condition depends on your symptoms. Treatment may include:  Using cool, wet cloths (cool compresses) or taking cool showers to stop the  itching.  Medicines that help: ? Relieve itching (antihistamines). ? Reduce swelling (corticosteroids). ? Treat infection (antibiotics).  A medicine (omalizumab) that is given as a shot (injection). Your doctor may prescribe this if you have hives that do not get better even after other treatments.  In very bad cases, you may need a shot of a medicine called epinephrine to prevent a life-threatening allergic reaction (anaphylaxis). Follow these instructions at home: Medicines  Take or apply over-the-counter and prescription medicines only as told by your doctor.  If you were prescribed an antibiotic medicine, use it as told by your doctor. Do not stop using it even if you start to feel better. Skin care  Apply cool, wet cloths to the hives.  Do not scratch your skin. Do not rub your skin. General instructions  Do not take hot showers or baths. This can make itching worse.  Do not wear tight clothes.  Use sunscreen and wear clothes that cover your skin when you are outside.  Avoid any triggers that cause your hives. Keep a journal to help track what causes your hives. Write down: ? What medicines you take. ? What you eat and drink. ? What products you use on your skin.  Keep all follow-up visits as told by your doctor. This is important. Contact a doctor if:  Your symptoms are not better with medicine.  Your joints hurt or are swollen. Get help right away if:  You have a fever.  You  have pain in your belly (abdomen).  Your tongue or lips are swollen.  Your eyelids are swollen.  Your chest or throat feels tight.  You have trouble breathing or swallowing. These symptoms may be an emergency. Do not wait to see if the symptoms will go away. Get medical help right away. Call your local emergency services (911 in the [place] Do not drive yourself to the hospital. Summary  Hives are itchy, red, swollen areas on your skin.  Treatment for this condition depends on your  symptoms.  Avoid things that cause your hives. Keep a journal to help track what causes your hives.  Take and apply over-the-counter and prescription medicines only as told by your doctor.  Keep all follow-up visits as told by your doctor. This is important. This information is not intended to replace advice given to you by your health care provider. Make sure you discuss any questions you have with your health care provider. Document Revised: 02/25/2019 Document Reviewed: 07/22/2017 Elsevier Patient Education  [place].

## 2020-06-05 NOTE — Assessment & Plan Note (Signed)
Colace or senna Patient prefers to take Senna PRN. Also reports occasional alternating diarrhea, could be a sign of IBS. Referred to GI for screening colonoscopy.

## 2020-06-06 ENCOUNTER — Other Ambulatory Visit: Payer: Self-pay | Admitting: Internal Medicine

## 2020-06-06 DIAGNOSIS — E039 Hypothyroidism, unspecified: Secondary | ICD-10-CM

## 2020-06-06 LAB — BASIC METABOLIC PANEL
BUN/Creatinine Ratio: 6 — ABNORMAL LOW (ref 9–23)
BUN: 5 mg/dL — ABNORMAL LOW (ref 6–24)
CO2: 23 mmol/L (ref 20–29)
Calcium: 9.5 mg/dL (ref 8.7–10.2)
Chloride: 102 mmol/L (ref 96–106)
Creatinine, Ser: 0.82 mg/dL (ref 0.57–1.00)
Glucose: 85 mg/dL (ref 65–99)
Potassium: 4.1 mmol/L (ref 3.5–5.2)
Sodium: 143 mmol/L (ref 134–144)
eGFR: 88 mL/min/{1.73_m2} (ref >59)

## 2020-06-06 LAB — TSH+FREE T4
Free T4: 1.29 ng/dL (ref 0.82–1.77)
TSH: 4.79 u[IU]/mL — ABNORMAL HIGH (ref 0.450–4.500)

## 2020-06-06 LAB — HEPATITIS C ANTIBODY: Hep C Virus Ab: <0.1 s/co ratio (ref 0.0–0.9)

## 2020-06-06 MED ORDER — LEVOTHYROXINE SODIUM 50 MCG PO TABS: 50.0000 ug | ORAL_TABLET | Freq: Every day | ORAL | 1 refills | Status: DC

## 2020-06-21 ENCOUNTER — Other Ambulatory Visit: Payer: Self-pay

## 2020-06-21 ENCOUNTER — Telehealth: Payer: BC Managed Care – PPO | Admitting: Internal Medicine

## 2020-06-21 ENCOUNTER — Encounter: Payer: Self-pay | Admitting: Internal Medicine

## 2020-06-21 DIAGNOSIS — J019 Acute sinusitis, unspecified: Secondary | ICD-10-CM

## 2020-06-21 MED ORDER — AMOXICILLIN-POT CLAVULANATE 875-125 MG PO TABS: 1.0000 | ORAL_TABLET | Freq: Two times a day (BID) | ORAL | 0 refills | Status: DC

## 2020-06-21 NOTE — Patient Instructions (Signed)

## 2020-06-21 NOTE — Progress Notes (Signed)
Virtual Visit via Telephone Note   This visit type was conducted due to national recommendations for restrictions regarding the COVID-19 Pandemic (e.g. social distancing) in an effort to limit this patient's exposure and mitigate transmission in our community.  Due to her co-morbid illnesses, this patient is at least at moderate risk for complications without adequate follow up.  This format is felt to be most appropriate for this patient at this time.  The patient did not have access to video technology/had technical difficulties with video requiring transitioning to audio format only (telephone).  All issues noted in this document were discussed and addressed.  No physical exam could be performed with this format.  Evaluation Performed:  Follow-up visit  Date:  06/21/2020   ID:  Erin Forbes, DOB 1970/07/27, MRN [id number]  Patient Location: Home Provider Location: Office/Clinic  Participants: Patient Location of Patient: Home Location of Provider: Telehealth Consent was obtain for visit to be over via telehealth. I verified that I am speaking with the correct person using two identifiers.  PCP:  [name], MD   Chief Complaint:  Cough and nasal congestion  History of Present Illness:    Erin Forbes is a 50 y.o. female who has a televisit for c/o cough, nasal congestion and sinus pressure related headache for last 4 days. She had negative COVID test at home. Denies any sick contacts. Denies fever, but has chills intermittently. Denies dyspnea or wheezing currently.  The patient does have symptoms concerning for COVID-19 infection (fever, chills, cough, or new shortness of breath).   Past Medical, Surgical, Social History, Allergies, and Medications have been Reviewed.  Past Medical History:  Diagnosis Date  . Headache 2000   Past Surgical History:  Procedure Laterality Date  . BREAST REDUCTION SURGERY Bilateral 10/30/2014   Procedure: BREAST REDUCTION WITH  LIPOSUCTION;  Surgeon: [name], MD;  Location: [facility];  Service: Plastics;  Laterality: Bilateral;  . BREAST SURGERY N/A    [facility] 10/18/2019  . [place], 2002   two  . CESAREAN SECTION N/A    [employer] 10/18/2019  . CHOLECYSTECTOMY    . MASS EXCISION Bilateral 10/30/2014   Procedure: BILATERAL EXCISION ACCESSORY BREAST TISSUE;  Surgeon: [name], MD;  Location: [facility];  Service: Plastics;  Laterality: Bilateral;  . REDUCTION MAMMAPLASTY Bilateral      Current Meds  Medication Sig  . levothyroxine (SYNTHROID) 50 MCG tablet Take 1 tablet (50 mcg total) by mouth daily before breakfast.     Allergies:   Prednisone   ROS:   Please see the history of present illness.     All other systems reviewed and are negative.   Labs/Other Tests and Data Reviewed:    Recent Labs: 12/06/2019: ALT 7; Hemoglobin 12.2; Platelets 255 06/05/2020: BUN 5; Creatinine, Ser 0.82; Potassium 4.1; Sodium 143; TSH 4.790   Recent Lipid Panel Lab Results  Component Value Date/Time   CHOL 118 12/06/2019 08:41 AM   TRIG 77 12/06/2019 08:41 AM   HDL 44 12/06/2019 08:41 AM   CHOLHDL 2.7 12/06/2019 08:41 AM   CHOLHDL 2.9 04/26/2015 10:02 AM   LDLCALC 58 12/06/2019 08:41 AM    Wt Readings from Last 3 Encounters:  06/05/20 186 lb 12.8 oz (84.7 kg)  12/06/19 191 lb (86.6 kg)  12/06/19 191 lb 1.9 oz (86.7 kg)      ASSESSMENT & PLAN:    Acute sinusitis Started Empiric Augmentin as persistent symptoms  despite using Mucinex Nasal saline spray PRN Advised to get COVID RT-PCR Use of humidifier advised  Time:   Today, I have spent 9 minutes reviewing the chart, including problem list, medications, and with the patient with telehealth technology discussing the above problems.   Medication Adjustments/Labs and Tests Ordered: Current medicines are reviewed at length with the patient today.  Concerns regarding medicines are outlined  above.   Tests Ordered: No orders of the defined types were placed in this encounter.   Medication Changes: No orders of the defined types were placed in this encounter.    Note: This dictation was prepared with Dragon dictation along with smaller phrase technology. Similar sounding words can be transcribed inadequately or may not be corrected upon review. Any transcriptional errors that result from this process are unintentional.      Disposition:  Follow up  Signed, [name], MD  06/21/2020 9:45 AM     [place]

## 2020-10-08 ENCOUNTER — Ambulatory Visit (INDEPENDENT_AMBULATORY_CARE_PROVIDER_SITE_OTHER): Payer: BC Managed Care – PPO | Admitting: Internal Medicine

## 2020-10-08 ENCOUNTER — Encounter: Payer: Self-pay | Admitting: Internal Medicine

## 2020-10-08 ENCOUNTER — Encounter (INDEPENDENT_AMBULATORY_CARE_PROVIDER_SITE_OTHER): Payer: Self-pay | Admitting: *Deleted

## 2020-10-08 ENCOUNTER — Other Ambulatory Visit: Payer: Self-pay

## 2020-10-08 VITALS — BP 129/84 | HR 85 | Temp 98.2°F | Resp 18 | Ht 65.0 in | Wt 187.1 lb

## 2020-10-08 DIAGNOSIS — Z1211 Encounter for screening for malignant neoplasm of colon: Secondary | ICD-10-CM

## 2020-10-08 DIAGNOSIS — E039 Hypothyroidism, unspecified: Secondary | ICD-10-CM | POA: Diagnosis not present

## 2020-10-08 DIAGNOSIS — E559 Vitamin D deficiency, unspecified: Secondary | ICD-10-CM | POA: Diagnosis not present

## 2020-10-08 DIAGNOSIS — Z0001 Encounter for general adult medical examination with abnormal findings: Secondary | ICD-10-CM | POA: Diagnosis not present

## 2020-10-08 DIAGNOSIS — R7303 Prediabetes: Secondary | ICD-10-CM | POA: Diagnosis not present

## 2020-10-08 DIAGNOSIS — E669 Obesity, unspecified: Secondary | ICD-10-CM

## 2020-10-08 NOTE — Patient Instructions (Addendum)
Please continue taking medications as prescribed.  Please continue to follow heart healthy diet and perform moderate exercise/walking at least 150 mins/week. 

## 2020-10-09 ENCOUNTER — Other Ambulatory Visit: Payer: Self-pay | Admitting: Internal Medicine

## 2020-10-09 DIAGNOSIS — E039 Hypothyroidism, unspecified: Secondary | ICD-10-CM

## 2020-10-09 LAB — CBC WITH DIFFERENTIAL/PLATELET
Basophils Absolute: 0.1 10*3/uL (ref 0.0–0.2)
Basos: 1 %
EOS (ABSOLUTE): 0.1 10*3/uL (ref 0.0–0.4)
Eos: 2 %
Hematocrit: 37.1 % (ref 34.0–46.6)
Hemoglobin: 11.9 g/dL (ref 11.1–15.9)
Immature Grans (Abs): 0 10*3/uL (ref 0.0–0.1)
Immature Granulocytes: 0 %
Lymphocytes Absolute: 2.7 10*3/uL (ref 0.7–3.1)
Lymphs: 35 %
MCH: 26.7 pg (ref 26.6–33.0)
MCHC: 32.1 g/dL (ref 31.5–35.7)
MCV: 83 fL (ref 79–97)
Monocytes Absolute: 0.5 10*3/uL (ref 0.1–0.9)
Monocytes: 7 %
Neutrophils Absolute: 4.2 10*3/uL (ref 1.4–7.0)
Neutrophils: 55 %
Platelets: 243 10*3/uL (ref 150–450)
RBC: 4.45 x10E6/uL (ref 3.77–5.28)
RDW: 14.2 % (ref 11.7–15.4)
WBC: 7.6 10*3/uL (ref 3.4–10.8)

## 2020-10-09 LAB — COMPREHENSIVE METABOLIC PANEL
ALT: 7 IU/L (ref 0–32)
AST: 13 IU/L (ref 0–40)
Albumin/Globulin Ratio: 1.6 (ref 1.2–2.2)
Albumin: 4 g/dL (ref 3.8–4.8)
Alkaline Phosphatase: 111 IU/L (ref 44–121)
BUN/Creatinine Ratio: 7 — ABNORMAL LOW (ref 9–23)
BUN: 6 mg/dL (ref 6–24)
Bilirubin Total: 0.3 mg/dL (ref 0.0–1.2)
CO2: 21 mmol/L (ref 20–29)
Calcium: 9.1 mg/dL (ref 8.7–10.2)
Chloride: 105 mmol/L (ref 96–106)
Creatinine, Ser: 0.81 mg/dL (ref 0.57–1.00)
Globulin, Total: 2.5 g/dL (ref 1.5–4.5)
Glucose: 92 mg/dL (ref 65–99)
Potassium: 4.4 mmol/L (ref 3.5–5.2)
Sodium: 140 mmol/L (ref 134–144)
Total Protein: 6.5 g/dL (ref 6.0–8.5)
eGFR: 88 mL/min/{1.73_m2} (ref >59)

## 2020-10-09 LAB — LIPID PANEL
Chol/HDL Ratio: 2.8 ratio (ref 0.0–4.4)
Cholesterol, Total: 112 mg/dL (ref 100–199)
HDL: 40 mg/dL (ref >39)
LDL Chol Calc (NIH): 54 mg/dL (ref 0–99)
Triglycerides: 94 mg/dL (ref 0–149)
VLDL Cholesterol Cal: 18 mg/dL (ref 5–40)

## 2020-10-09 LAB — HEMOGLOBIN A1C
Est. average glucose Bld gHb Est-mCnc: 117 mg/dL
Hgb A1c MFr Bld: 5.7 % — ABNORMAL HIGH (ref 4.8–5.6)

## 2020-10-09 LAB — TSH+FREE T4
Free T4: 1.07 ng/dL (ref 0.82–1.77)
TSH: 4.94 u[IU]/mL — ABNORMAL HIGH (ref 0.450–4.500)

## 2020-10-09 LAB — VITAMIN D 25 HYDROXY (VIT D DEFICIENCY, FRACTURES): Vit D, 25-Hydroxy: 20.3 ng/mL — ABNORMAL LOW (ref 30.0–100.0)

## 2020-10-09 MED ORDER — LEVOTHYROXINE SODIUM 75 MCG PO TABS: 75.0000 ug | ORAL_TABLET | Freq: Every day | ORAL | 1 refills | Status: DC

## 2020-10-11 ENCOUNTER — Telehealth: Payer: Self-pay

## 2020-10-11 ENCOUNTER — Other Ambulatory Visit: Payer: Self-pay | Admitting: *Deleted

## 2020-10-11 DIAGNOSIS — E039 Hypothyroidism, unspecified: Secondary | ICD-10-CM

## 2020-10-11 MED ORDER — LEVOTHYROXINE SODIUM 75 MCG PO TABS: 75.0000 ug | ORAL_TABLET | Freq: Every day | ORAL | 1 refills | Status: DC

## 2020-10-11 NOTE — Telephone Encounter (Signed)
Pt medication sent to pharmacy  

## 2020-10-11 NOTE — Telephone Encounter (Signed)
Patient called asked if this medicine levothyroxine (SYNTHROID) 75 MCG tablet  can be sent to [facility].

## 2020-10-12 NOTE — Assessment & Plan Note (Signed)
Lab Results  Component Value Date   TSH 4.940 (H) 10/08/2020   Increased Levothyroxine 75 mcg QD Checked TSH and free T4

## 2020-10-12 NOTE — Assessment & Plan Note (Addendum)
Advised to take vitamin D supplements Check vitamin D level

## 2020-10-12 NOTE — Progress Notes (Signed)
Established Patient Office Visit  Subjective:  Patient ID: Erin Forbes, female    DOB: July 17, 1970  Age: 50 y.o. MRN: [id number]  CC:  Chief Complaint  Patient presents with   Hypothyroidism    HPI Erin Forbes is a 50 y.o. female with PMH of hypothyroidism, constipation, seasonal allergies and breast surgery for macromastia who presents for presents for follow up of her chronic medical conditions.   Hypothyroidism: Takes Levothyroxine regularly. Denies any throat pain, palpitations, tremors or recent skin or nail changes.  Addendum: Her TSH level was still elevated and levothyroxine dose is increased.  Past Medical History:  Diagnosis Date   Headache 2000    Past Surgical History:  Procedure Laterality Date   BREAST REDUCTION SURGERY Bilateral 10/30/2014   Procedure: BREAST REDUCTION WITH LIPOSUCTION;  Surgeon: [name], MD;  Location: [facility];  Service: Plastics;  Laterality: Bilateral;   BREAST SURGERY N/A    [employer] 10/18/2019   CESAREAN SECTION  1995, 2002   two   CESAREAN SECTION N/A    [employer]ia 10/18/2019   CHOLECYSTECTOMY     MASS EXCISION Bilateral 10/30/2014   Procedure: BILATERAL EXCISION ACCESSORY BREAST TISSUE;  Surgeon: [name], MD;  Location: [facility];  Service: Plastics;  Laterality: Bilateral;   REDUCTION MAMMAPLASTY Bilateral     Family History  Problem Relation Age of Onset   Stroke Mother 92   Hypertension Mother    Hypertension Father    Hypertension Sister    Hypertension Brother    Breast cancer Paternal Grandmother     Social History   Socioeconomic History   Marital status: Single    Spouse name: Not on file   Number of children: Not on file   Years of education: Not on file   Highest education level: Not on file  Occupational History    Employer: [employer]  Tobacco Use   Smoking status: Former    Types: Cigarettes   Smokeless tobacco: Never   Tobacco comments:     Quit 19 years ago, occasionally used to smoke 1 cigarette, smoked for about 2 years  Vaping Use   Vaping Use: Never used  Substance and Sexual Activity   Alcohol use: Yes    Comment: occ   Drug use: No   Sexual activity: Not Currently    Birth control/protection: None  Other Topics Concern   Not on file  Social History Narrative   Not on file   Social Determinants of Health   Financial Resource Strain: Medium Risk   Difficulty of Paying Living Expenses: Somewhat hard  Food Insecurity: Food Insecurity Present   Worried About Running Out of Food in the Last Year: Sometimes true   Ran Out of Food in the Last Year: Never true  Transportation Needs: No Transportation Needs   Lack of Transportation (Medical): No   Lack of Transportation (Non-Medical): No  Physical Activity: Insufficiently Active   Days of Exercise per Week: 2 days   Minutes of Exercise per Session: 20 min  Stress: No Stress Concern Present   Feeling of Stress : Only a little  Social Connections: Unknown   Frequency of Communication with Friends and Family: Three times a week   Frequency of Social Gatherings with Friends and Family: More than three times a week   Attends Religious Services: Patient refused   Active Member of Clubs or Organizations: Yes   Attends Archivist Meetings: 1 to 4 times per  year   Marital Status: Never married  Human resources officer Violence: Not At Risk   Fear of Current or Ex-Partner: No   Emotionally Abused: No   Physically Abused: No   Sexually Abused: No    Outpatient Medications Prior to Visit  Medication Sig Dispense Refill   levothyroxine (SYNTHROID) 50 MCG tablet Take 1 tablet (50 mcg total) by mouth daily before breakfast. 90 tablet 1   amoxicillin-clavulanate (AUGMENTIN) 875-125 MG tablet Take 1 tablet by mouth 2 (two) times daily. (Patient not taking: Reported on 10/08/2020) 20 tablet 0   No facility-administered medications prior to visit.    Allergies  Allergen  Reactions   Prednisone Rash    ROS Review of Systems  Constitutional:  Negative for chills and fever.  HENT:  Negative for congestion, sinus pressure, sinus pain and sore throat.   Eyes:  Negative for pain and discharge.  Respiratory:  Negative for cough and shortness of breath.   Cardiovascular:  Negative for chest pain and palpitations.  Gastrointestinal:  Positive for constipation. Negative for abdominal pain, nausea and vomiting.  Endocrine: Negative for polydipsia and polyuria.  Genitourinary:  Negative for dysuria and hematuria.  Musculoskeletal:  Negative for neck pain and neck stiffness.  Skin:  Negative for rash.  Neurological:  Negative for dizziness and weakness.  Psychiatric/Behavioral:  Negative for agitation and behavioral problems.      Objective:    Physical Exam Vitals reviewed.  Constitutional:      General: She is not in acute distress.    Appearance: She is not diaphoretic.  HENT:     Head: Normocephalic and atraumatic.     Nose: Nose normal. No congestion.     Mouth/Throat:     Mouth: Mucous membranes are moist.     Pharynx: No posterior oropharyngeal erythema.  Eyes:     General: No scleral icterus.    Extraocular Movements: Extraocular movements intact.  Cardiovascular:     Rate and Rhythm: Normal rate and regular rhythm.     Pulses: Normal pulses.     Heart sounds: Normal heart sounds. No murmur heard. Pulmonary:     Breath sounds: Normal breath sounds. No wheezing or rales.  Musculoskeletal:     Cervical back: Neck supple. No tenderness.     Right lower leg: No edema.     Left lower leg: No edema.  Skin:    General: Skin is warm.     Findings: No rash.  Neurological:     General: No focal deficit present.     Mental Status: She is alert and oriented to person, place, and time.     Sensory: No sensory deficit.     Motor: No weakness.  Psychiatric:        Mood and Affect: Mood normal.        Behavior: Behavior normal.    BP 129/84 (BP  Location: Left Arm, Patient Position: Sitting, Cuff Size: Normal)   Pulse 85   Temp 98.2 F (36.8 C) (Oral)   Resp 18   Ht 5' 5"  (1.651 m)   Wt 187 lb 1.9 oz (84.9 kg)   SpO2 96%   BMI 31.14 kg/m  Wt Readings from Last 3 Encounters:  10/08/20 187 lb 1.9 oz (84.9 kg)  06/05/20 186 lb 12.8 oz (84.7 kg)  12/06/19 191 lb (86.6 kg)     Health Maintenance Due  Topic Date Due   COLONOSCOPY (Pts 45-46yr Insurance coverage will need to be confirmed)  Never done  TETANUS/TDAP  07/04/2018   COVID-19 Vaccine (3 - Booster for Moderna series) 05/15/2020   Zoster Vaccines- Shingrix (1 of 2) Never done    There are no preventive care reminders to display for this patient.  Lab Results  Component Value Date   TSH 4.940 (H) 10/08/2020   Lab Results  Component Value Date   WBC 7.6 10/08/2020   HGB 11.9 10/08/2020   HCT 37.1 10/08/2020   MCV 83 10/08/2020   PLT 243 10/08/2020   Lab Results  Component Value Date   NA 140 10/08/2020   K 4.4 10/08/2020   CO2 21 10/08/2020   GLUCOSE 92 10/08/2020   BUN 6 10/08/2020   CREATININE 0.81 10/08/2020   BILITOT 0.3 10/08/2020   ALKPHOS 111 10/08/2020   AST 13 10/08/2020   ALT 7 10/08/2020   PROT 6.5 10/08/2020   ALBUMIN 4.0 10/08/2020   CALCIUM 9.1 10/08/2020   ANIONGAP 5 08/28/2018   EGFR 88 10/08/2020   Lab Results  Component Value Date   CHOL 112 10/08/2020   Lab Results  Component Value Date   HDL 40 10/08/2020   Lab Results  Component Value Date   LDLCALC 54 10/08/2020   Lab Results  Component Value Date   TRIG 94 10/08/2020   Lab Results  Component Value Date   CHOLHDL 2.8 10/08/2020   Lab Results  Component Value Date   HGBA1C 5.7 (H) 10/08/2020      Assessment & Plan:   Problem List Items Addressed This Visit       Endocrine   Hypothyroidism - Primary    Lab Results  Component Value Date   TSH 4.940 (H) 10/08/2020  Increased Levothyroxine 75 mcg QD Checked TSH and free T4      Relevant Orders    TSH + free T4     Other   Obesity (BMI 30.0-34.9)    Diet modification advised Moderate exercise/walking for 30 mins/day for 5 days in a week advised Will encourage in the subsequent visits      Vitamin D deficiency    Advised to take vitamin D supplements Check vitamin D level      Relevant Orders   Vitamin D (25 hydroxy)   Other Visit Diagnoses     Colon cancer screening       Relevant Orders   Ambulatory referral to Gastroenterology     No orders of the defined types were placed in this encounter.   Follow-up: Return in about 3 months (around 01/07/2021) for Annual physical.    [name], MD

## 2020-10-12 NOTE — Assessment & Plan Note (Signed)
Diet modification advised Moderate exercise/walking for 30 mins/day for 5 days in a week advised Will encourage in the subsequent visits

## 2020-12-13 IMAGING — DX DG ANKLE COMPLETE 3+V*R*
3 series · 3 of 3 positions shown · non-contrast
Comparison: None.

CLINICAL DATA: Pain following rolling type injury

EXAM:
RIGHT ANKLE - COMPLETE 3+ VIEW

[ankle ap]
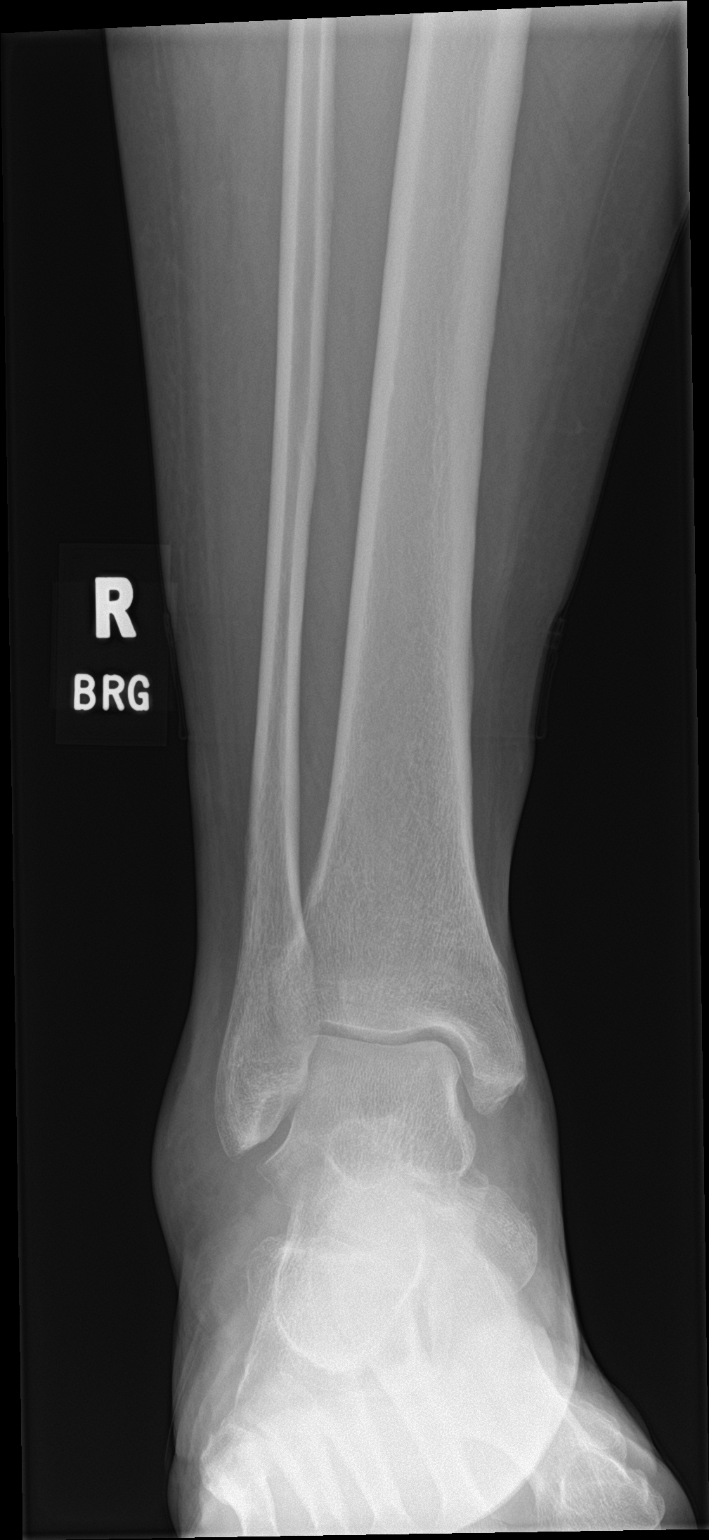

[ankle obl]
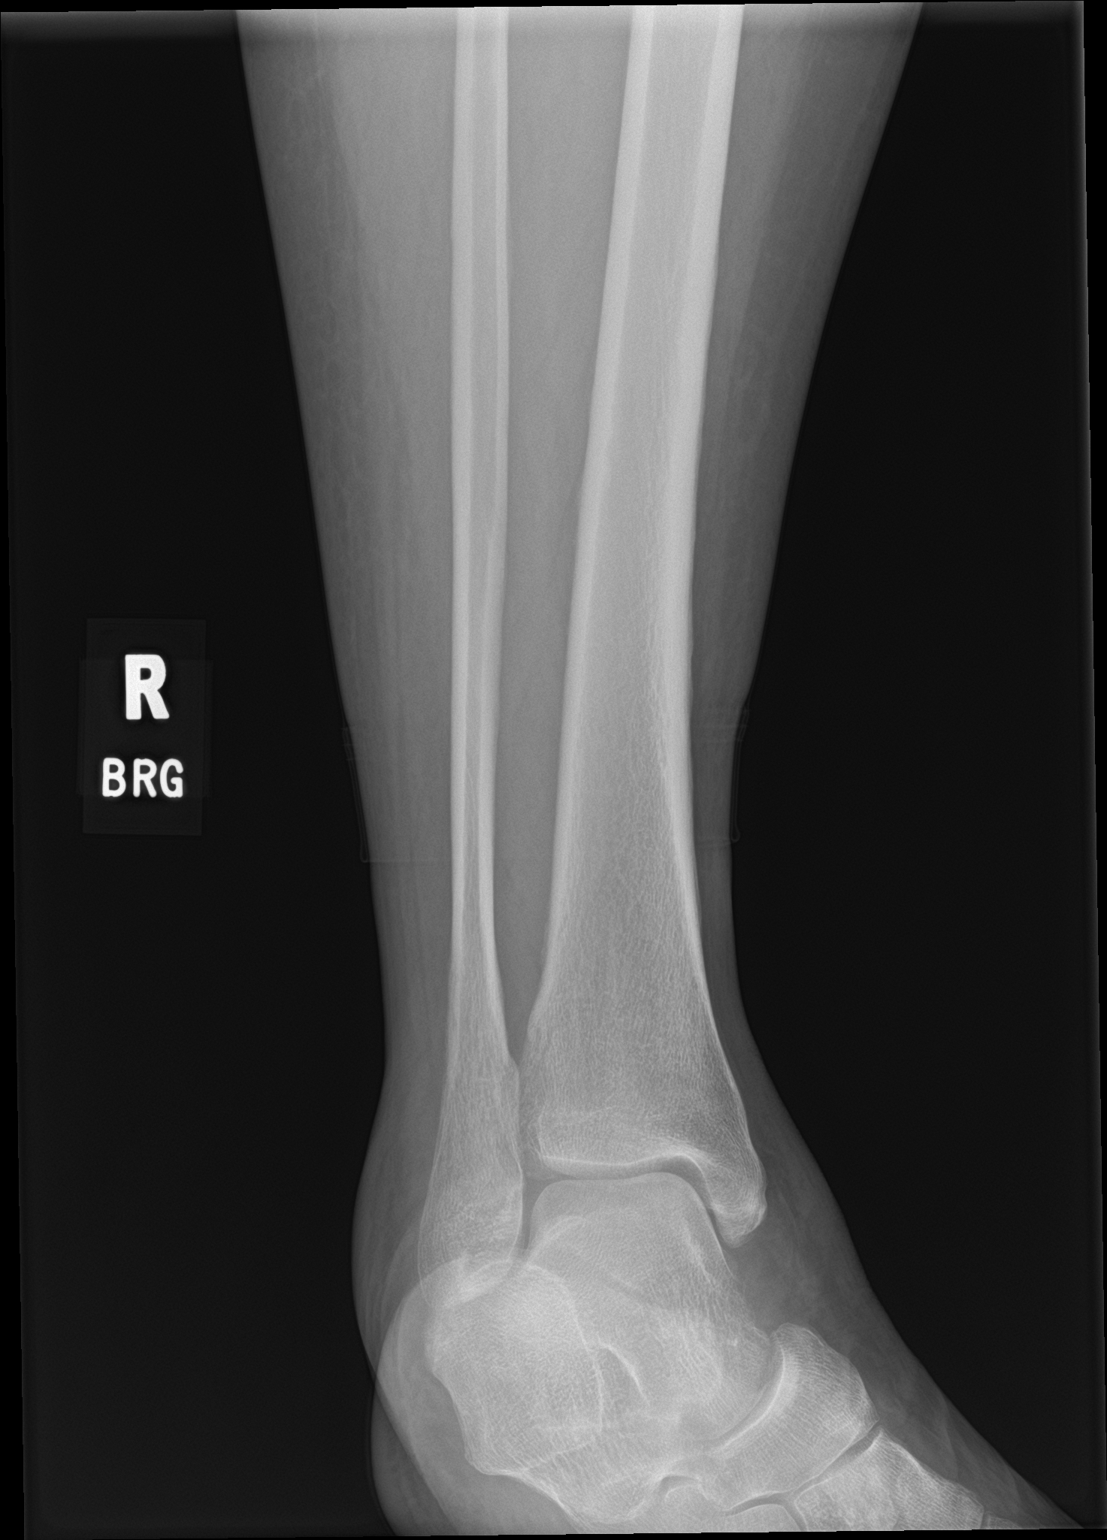

[ankle lat]
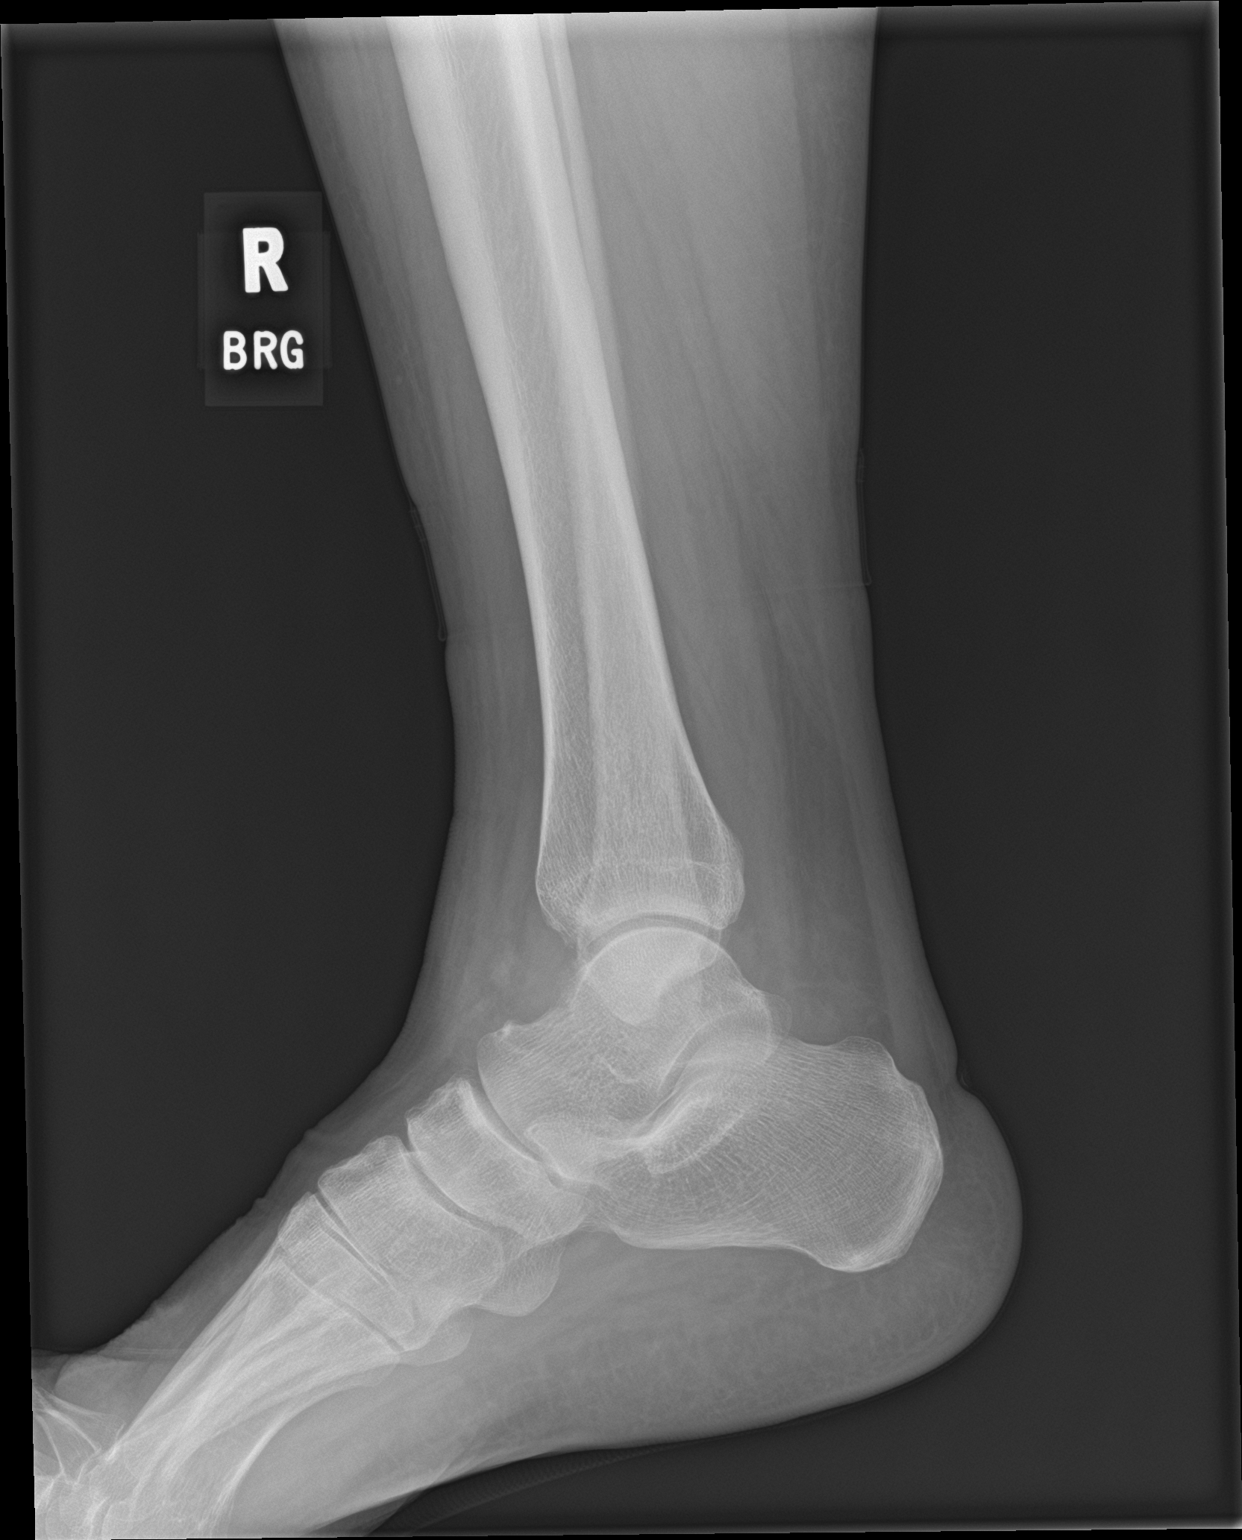

[3 of 3 positions shown; findings below may reference images not displayed]

FINDINGS: Frontal, oblique, and lateral views were obtained. There is soft
tissue swelling laterally. There is no appreciable fracture. There
is a small joint effusion. There is no appreciable joint space
narrowing or erosion. Ankle mortise appears intact.
IMPRESSION: Soft tissue swelling laterally with small joint effusion. Question
underlying ligamentous injury. No fracture evident. No appreciable
joint space narrowing. Ankle mortise appears intact.

## 2021-01-09 ENCOUNTER — Encounter (INDEPENDENT_AMBULATORY_CARE_PROVIDER_SITE_OTHER): Payer: Self-pay | Admitting: *Deleted

## 2021-01-09 ENCOUNTER — Encounter: Payer: Self-pay | Admitting: Internal Medicine

## 2021-01-09 ENCOUNTER — Ambulatory Visit (INDEPENDENT_AMBULATORY_CARE_PROVIDER_SITE_OTHER): Payer: BC Managed Care – PPO | Admitting: Internal Medicine

## 2021-01-09 ENCOUNTER — Other Ambulatory Visit: Payer: Self-pay

## 2021-01-09 VITALS — BP 130/84 | HR 79 | Resp 17 | Ht 65.5 in | Wt 184.1 lb

## 2021-01-09 DIAGNOSIS — Z23 Encounter for immunization: Secondary | ICD-10-CM

## 2021-01-09 DIAGNOSIS — Z1211 Encounter for screening for malignant neoplasm of colon: Secondary | ICD-10-CM | POA: Diagnosis not present

## 2021-01-09 DIAGNOSIS — E039 Hypothyroidism, unspecified: Secondary | ICD-10-CM | POA: Diagnosis not present

## 2021-01-09 DIAGNOSIS — Z0001 Encounter for general adult medical examination with abnormal findings: Secondary | ICD-10-CM | POA: Diagnosis not present

## 2021-01-09 DIAGNOSIS — Z2821 Immunization not carried out because of patient refusal: Secondary | ICD-10-CM | POA: Diagnosis not present

## 2021-01-09 NOTE — Assessment & Plan Note (Signed)

## 2021-01-09 NOTE — Progress Notes (Signed)
Established Patient Office Visit  Subjective:  Patient ID: Erin Forbes, female    DOB: 1970-04-07  Age: 50 y.o. MRN: [id number]  CC:  Chief Complaint  Patient presents with   Annual Exam    HPI Erin Forbes is a 50 y.o. female with past medical history of hypothyroidism, constipation, seasonal allergies and breast surgery for macromastia who presents for annual physical.  Hypothyroidism: Has run out of levothyroxine for about a week now, usually takes Levothyroxine regularly. Denies any throat pain, palpitations, tremors or recent skin or nail changes.  She has not had visit with GI yet for screening colonoscopy.  She received first dose of Shingrix vaccine in the office today.  She denied flu vaccine.  Past Medical History:  Diagnosis Date   Headache 2000    Past Surgical History:  Procedure Laterality Date   BREAST REDUCTION SURGERY Bilateral 10/30/2014   Procedure: BREAST REDUCTION WITH LIPOSUCTION;  Surgeon: [name], MD;  Location: [place];  Service: Plastics;  Laterality: Bilateral;   BREAST SURGERY N/A    [facility]reesia 10/18/2019   CESAREAN SECTION  1995, 2002   two   CESAREAN SECTION N/A    [facility]ia 10/18/2019   CHOLECYSTECTOMY     MASS EXCISION Bilateral 10/30/2014   Procedure: BILATERAL EXCISION ACCESSORY BREAST TISSUE;  Surgeon: [name], MD;  Location: [place];  Service: Plastics;  Laterality: Bilateral;   REDUCTION MAMMAPLASTY Bilateral     Family History  Problem Relation Age of Onset   Stroke Mother 47   Hypertension Mother    Hypertension Father    Hypertension Sister    Hypertension Brother    Breast cancer Paternal Grandmother     Social History   Socioeconomic History   Marital status: Single    Spouse name: Not on file   Number of children: Not on file   Years of education: Not on file   Highest education level: Not on file  Occupational History    Employer: [employer]  Tobacco  Use   Smoking status: Former    Types: Cigarettes   Smokeless tobacco: Never   Tobacco comments:    Quit 19 years ago, occasionally used to smoke 1 cigarette, smoked for about 2 years  Vaping Use   Vaping Use: Never used  Substance and Sexual Activity   Alcohol use: Yes    Comment: occ   Drug use: No   Sexual activity: Not Currently    Birth control/protection: None  Other Topics Concern   Not on file  Social History Narrative   Not on file   Social Determinants of Health   Financial Resource Strain: Not on file  Food Insecurity: Not on file  Transportation Needs: Not on file  Physical Activity: Not on file  Stress: Not on file  Social Connections: Not on file  Intimate Partner Violence: Not on file    Outpatient Medications Prior to Visit  Medication Sig Dispense Refill   levothyroxine (SYNTHROID) 75 MCG tablet Take 1 tablet (75 mcg total) by mouth daily before breakfast. 90 tablet 1   No facility-administered medications prior to visit.    Allergies  Allergen Reactions   Prednisone Rash    ROS Review of Systems  Constitutional:  Negative for chills and fever.  HENT:  Negative for congestion, sinus pressure, sinus pain and sore throat.   Eyes:  Negative for pain and discharge.  Respiratory:  Negative for cough and shortness of breath.  Cardiovascular:  Negative for chest pain and palpitations.  Gastrointestinal:  Positive for constipation. Negative for abdominal pain, nausea and vomiting.  Endocrine: Negative for polydipsia and polyuria.  Genitourinary:  Negative for dysuria and hematuria.  Musculoskeletal:  Negative for neck pain and neck stiffness.  Skin:  Negative for rash.  Neurological:  Negative for dizziness and weakness.  Psychiatric/Behavioral:  Negative for agitation and behavioral problems.      Objective:    Physical Exam Vitals reviewed.  Constitutional:      General: She is not in acute distress.    Appearance: She is obese. She is not  diaphoretic.  HENT:     Head: Normocephalic and atraumatic.     Nose: Nose normal. No congestion.     Mouth/Throat:     Mouth: Mucous membranes are moist.     Pharynx: No posterior oropharyngeal erythema.  Eyes:     General: No scleral icterus.    Extraocular Movements: Extraocular movements intact.  Cardiovascular:     Rate and Rhythm: Normal rate and regular rhythm.     Pulses: Normal pulses.     Heart sounds: Normal heart sounds. No murmur heard. Pulmonary:     Breath sounds: Normal breath sounds. No wheezing or rales.  Abdominal:     Palpations: Abdomen is soft.     Tenderness: There is no abdominal tenderness.  Musculoskeletal:     Cervical back: Neck supple. No tenderness.     Right lower leg: No edema.     Left lower leg: No edema.  Skin:    General: Skin is warm.     Findings: No rash.  Neurological:     General: No focal deficit present.     Mental Status: She is alert and oriented to person, place, and time.     Cranial Nerves: No cranial nerve deficit.     Sensory: No sensory deficit.     Motor: No weakness.  Psychiatric:        Mood and Affect: Mood normal.        Behavior: Behavior normal.    BP 130/84    Pulse 79    Resp 17    Ht 5' 5.5" (1.664 m)    Wt 184 lb 1.9 oz (83.5 kg)    SpO2 97%    BMI 30.17 kg/m  Wt Readings from Last 3 Encounters:  01/09/21 184 lb 1.9 oz (83.5 kg)  10/08/20 187 lb 1.9 oz (84.9 kg)  06/05/20 186 lb 12.8 oz (84.7 kg)    Lab Results  Component Value Date   TSH 4.940 (H) 10/08/2020   Lab Results  Component Value Date   WBC 7.6 10/08/2020   HGB 11.9 10/08/2020   HCT 37.1 10/08/2020   MCV 83 10/08/2020   PLT 243 10/08/2020   Lab Results  Component Value Date   NA 140 10/08/2020   K 4.4 10/08/2020   CO2 21 10/08/2020   GLUCOSE 92 10/08/2020   BUN 6 10/08/2020   CREATININE 0.81 10/08/2020   BILITOT 0.3 10/08/2020   ALKPHOS 111 10/08/2020   AST 13 10/08/2020   ALT 7 10/08/2020   PROT 6.5 10/08/2020   ALBUMIN 4.0  10/08/2020   CALCIUM 9.1 10/08/2020   ANIONGAP 5 08/28/2018   EGFR 88 10/08/2020   Lab Results  Component Value Date   CHOL 112 10/08/2020   Lab Results  Component Value Date   HDL 40 10/08/2020   Lab Results  Component Value Date  LDLCALC 54 10/08/2020   Lab Results  Component Value Date   TRIG 94 10/08/2020   Lab Results  Component Value Date   CHOLHDL 2.8 10/08/2020   Lab Results  Component Value Date   HGBA1C 5.7 (H) 10/08/2020      Assessment & Plan:   Problem List Items Addressed This Visit       Endocrine   Hypothyroidism    Lab Results  Component Value Date   TSH 4.940 (H) 10/08/2020  Had increased dose of Levothyroxine to 75 mg QD Check TSH and free T4      Relevant Orders   TSH + free T4   Basic Metabolic Panel (BMET)     Other   Encounter for general adult medical examination with abnormal findings - Primary    Physical exam as documented. Counseling done  re healthy lifestyle involving commitment to 150 minutes exercise per week, heart healthy diet, and attaining healthy weight.The importance of adequate sleep also discussed. Changes in health habits are decided on by the patient with goals and time frames  set for achieving them. Immunization and cancer screening needs are specifically addressed at this visit.      Other Visit Diagnoses     Colon cancer screening       Relevant Orders   Ambulatory referral to Gastroenterology   Refused influenza vaccine       Need for shingles vaccine       Relevant Orders   Varicella-zoster vaccine IM (Completed)       No orders of the defined types were placed in this encounter.   Follow-up: Return in about 6 months (around 07/10/2021) for Hypothyroidism.    [name], MD

## 2021-01-09 NOTE — Assessment & Plan Note (Signed)
Lab Results  Component Value Date   TSH 4.940 (H) 10/08/2020   Had increased dose of Levothyroxine to 75 mg QD Check TSH and free T4

## 2021-01-09 NOTE — Patient Instructions (Signed)
Please start taking Vitamin D 5000 IU once daily.  Please continue taking Levothyroxine regularly.  Please continue to follow heart healthy diet and perform moderate exercise/walking at least 150 mins/week.

## 2021-01-10 ENCOUNTER — Other Ambulatory Visit: Payer: Self-pay | Admitting: Internal Medicine

## 2021-01-10 DIAGNOSIS — E039 Hypothyroidism, unspecified: Secondary | ICD-10-CM

## 2021-01-10 LAB — BASIC METABOLIC PANEL
BUN/Creatinine Ratio: 8 — ABNORMAL LOW (ref 9–23)
BUN: 6 mg/dL (ref 6–24)
CO2: 25 mmol/L (ref 20–29)
Calcium: 9.1 mg/dL (ref 8.7–10.2)
Chloride: 104 mmol/L (ref 96–106)
Creatinine, Ser: 0.75 mg/dL (ref 0.57–1.00)
Glucose: 87 mg/dL (ref 70–99)
Potassium: 4.3 mmol/L (ref 3.5–5.2)
Sodium: 141 mmol/L (ref 134–144)
eGFR: 97 mL/min/{1.73_m2} (ref >59)

## 2021-01-10 LAB — TSH+FREE T4
Free T4: 1.33 ng/dL (ref 0.82–1.77)
TSH: 4.71 u[IU]/mL — ABNORMAL HIGH (ref 0.450–4.500)

## 2021-01-10 MED ORDER — LEVOTHYROXINE SODIUM 75 MCG PO TABS: 75.0000 ug | ORAL_TABLET | Freq: Every day | ORAL | 1 refills | Status: DC

## 2021-01-15 ENCOUNTER — Encounter: Payer: Self-pay | Admitting: *Deleted

## 2021-07-10 ENCOUNTER — Ambulatory Visit: Payer: BC Managed Care – PPO | Admitting: Internal Medicine

## 2021-07-10 ENCOUNTER — Encounter: Payer: Self-pay | Admitting: Internal Medicine

## 2021-07-10 ENCOUNTER — Encounter (INDEPENDENT_AMBULATORY_CARE_PROVIDER_SITE_OTHER): Payer: Self-pay | Admitting: *Deleted

## 2021-07-10 ENCOUNTER — Telehealth: Payer: Self-pay

## 2021-07-10 VITALS — BP 138/66 | HR 67 | Resp 18 | Ht 65.5 in | Wt 189.8 lb

## 2021-07-10 DIAGNOSIS — E039 Hypothyroidism, unspecified: Secondary | ICD-10-CM | POA: Diagnosis not present

## 2021-07-10 DIAGNOSIS — E669 Obesity, unspecified: Secondary | ICD-10-CM

## 2021-07-10 DIAGNOSIS — R7303 Prediabetes: Secondary | ICD-10-CM | POA: Diagnosis not present

## 2021-07-10 DIAGNOSIS — Z23 Encounter for immunization: Secondary | ICD-10-CM

## 2021-07-10 DIAGNOSIS — Z1211 Encounter for screening for malignant neoplasm of colon: Secondary | ICD-10-CM | POA: Diagnosis not present

## 2021-07-10 NOTE — Patient Instructions (Signed)
Please continue taking medications as prescribed.  Please continue to follow low carb diet and perform moderate exercise/walking at least 150 mins/week. 

## 2021-07-10 NOTE — Progress Notes (Unsigned)
Established Patient Office Visit  Subjective:  Patient ID: Erin Forbes, female    DOB: Nov 06, 1970  Age: 51 y.o. MRN: [id number]  CC:  Chief Complaint  Patient presents with   Follow-up    6 month follow up hypothyoroidism     HPI Erin Forbes is a 51 y.o. female with past medical history of hypothyroidism, constipation, seasonal allergies and breast surgery for macromastia who presents for f/u of her chronic medical conditions.    Past Medical History:  Diagnosis Date   Headache 2000    Past Surgical History:  Procedure Laterality Date   BREAST REDUCTION SURGERY Bilateral 10/30/2014   Procedure: BREAST REDUCTION WITH LIPOSUCTION;  Surgeon: [name], MD;  Location: [facility];  Service: Plastics;  Laterality: Bilateral;   BREAST SURGERY N/A    [facility]ia 10/18/2019   CESAREAN SECTION  1995, 2002   two   CESAREAN SECTION N/A    [facility]ia 10/18/2019   CHOLECYSTECTOMY     MASS EXCISION Bilateral 10/30/2014   Procedure: BILATERAL EXCISION ACCESSORY BREAST TISSUE;  Surgeon: [name], MD;  Location: [place];  Service: Plastics;  Laterality: Bilateral;   REDUCTION MAMMAPLASTY Bilateral     Family History  Problem Relation Age of Onset   Stroke Mother 65   Hypertension Mother    Hypertension Father    Hypertension Sister    Hypertension Brother    Breast cancer Paternal Grandmother     Social History   Socioeconomic History   Marital status: Single    Spouse name: Not on file   Number of children: Not on file   Years of education: Not on file   Highest education level: Not on file  Occupational History    Employer: [employer]  Tobacco Use   Smoking status: Former    Types: Cigarettes   Smokeless tobacco: Never   Tobacco comments:    Quit 19 years ago, occasionally used to smoke 1 cigarette, smoked for about 2 years  Vaping Use   Vaping Use: Never used  Substance and Sexual Activity   Alcohol use: Yes     Comment: occ   Drug use: No   Sexual activity: Not Currently    Birth control/protection: None  Other Topics Concern   Not on file  Social History Narrative   Not on file   Social Determinants of Health   Financial Resource Strain: Medium Risk (12/06/2019)   Overall Financial Resource Strain (CARDIA)    Difficulty of Paying Living Expenses: Somewhat hard  Food Insecurity: Food Insecurity Present (12/06/2019)   Hunger Vital Sign    Worried About [place] in the Last Year: Sometimes true    Ran Out of Food in the Last Year: Never true  Transportation Needs: No Transportation Needs (12/06/2019)   PRAPARE - Hydrologist (Medical): No    Lack of Transportation (Non-Medical): No  Physical Activity: Insufficiently Active (12/06/2019)   Exercise Vital Sign    Days of Exercise per Week: 2 days    Minutes of Exercise per Session: 20 min  Stress: No Stress Concern Present (12/06/2019)   [place]    Feeling of Stress : Only a little  Social Connections: Unknown (12/06/2019)   Social Connection and Isolation Panel [NHANES]    Frequency of Communication with Friends and Family: Three times a week    Frequency of Social Gatherings with Friends  and Family: More than three times a week    Attends Religious Services: Patient refused    Active Member of Clubs or Organizations: Yes    Attends Archivist Meetings: 1 to 4 times per year    Marital Status: Never married  Intimate Partner Violence: Not At Risk (12/06/2019)   Humiliation, Afraid, Rape, and Kick questionnaire    Fear of Current or Ex-Partner: No    Emotionally Abused: No    Physically Abused: No    Sexually Abused: No    Outpatient Medications Prior to Visit  Medication Sig Dispense Refill   levothyroxine (SYNTHROID) 75 MCG tablet Take 1 tablet (75 mcg total) by mouth daily before breakfast. 90 tablet 1   No  facility-administered medications prior to visit.    Allergies  Allergen Reactions   Prednisone Rash    ROS Review of Systems  Constitutional:  Negative for chills and fever.  HENT:  Negative for congestion, sinus pressure, sinus pain and sore throat.   Eyes:  Negative for pain and discharge.  Respiratory:  Negative for cough and shortness of breath.   Cardiovascular:  Negative for chest pain and palpitations.  Gastrointestinal:  Positive for constipation. Negative for abdominal pain, nausea and vomiting.  Endocrine: Negative for polydipsia and polyuria.  Genitourinary:  Negative for dysuria and hematuria.  Musculoskeletal:  Negative for neck pain and neck stiffness.  Skin:  Negative for rash.  Neurological:  Negative for dizziness and weakness.  Psychiatric/Behavioral:  Negative for agitation and behavioral problems.       Objective:    Physical Exam Vitals reviewed.  Constitutional:      General: She is not in acute distress.    Appearance: She is obese. She is not diaphoretic.  HENT:     Head: Normocephalic and atraumatic.     Nose: Nose normal. No congestion.     Mouth/Throat:     Mouth: Mucous membranes are moist.     Pharynx: No posterior oropharyngeal erythema.  Eyes:     General: No scleral icterus.    Extraocular Movements: Extraocular movements intact.  Cardiovascular:     Rate and Rhythm: Normal rate and regular rhythm.     Pulses: Normal pulses.     Heart sounds: Normal heart sounds. No murmur heard. Pulmonary:     Breath sounds: Normal breath sounds. No wheezing or rales.  Musculoskeletal:     Cervical back: Neck supple. No tenderness.     Right lower leg: No edema.     Left lower leg: No edema.  Skin:    General: Skin is warm.     Findings: No rash.  Neurological:     General: No focal deficit present.     Mental Status: She is alert and oriented to person, place, and time.     Sensory: No sensory deficit.     Motor: No weakness.  Psychiatric:         Mood and Affect: Mood normal.        Behavior: Behavior normal.     BP 138/66 (BP Location: Right Arm, Patient Position: Sitting, Cuff Size: Normal)   Pulse 67   Resp 18   Ht 5' 5.5" (1.664 m)   Wt 189 lb 12.8 oz (86.1 kg)   SpO2 100%   BMI 31.10 kg/m  Wt Readings from Last 3 Encounters:  07/10/21 189 lb 12.8 oz (86.1 kg)  01/09/21 184 lb 1.9 oz (83.5 kg)  10/08/20 187 lb 1.9 oz (84.9  kg)    Lab Results  Component Value Date   TSH 4.710 (H) 01/09/2021   Lab Results  Component Value Date   WBC 7.6 10/08/2020   HGB 11.9 10/08/2020   HCT 37.1 10/08/2020   MCV 83 10/08/2020   PLT 243 10/08/2020   Lab Results  Component Value Date   NA 141 01/09/2021   K 4.3 01/09/2021   CO2 25 01/09/2021   GLUCOSE 87 01/09/2021   BUN 6 01/09/2021   CREATININE 0.75 01/09/2021   BILITOT 0.3 10/08/2020   ALKPHOS 111 10/08/2020   AST 13 10/08/2020   ALT 7 10/08/2020   PROT 6.5 10/08/2020   ALBUMIN 4.0 10/08/2020   CALCIUM 9.1 01/09/2021   ANIONGAP 5 08/28/2018   EGFR 97 01/09/2021   Lab Results  Component Value Date   CHOL 112 10/08/2020   Lab Results  Component Value Date   HDL 40 10/08/2020   Lab Results  Component Value Date   LDLCALC 54 10/08/2020   Lab Results  Component Value Date   TRIG 94 10/08/2020   Lab Results  Component Value Date   CHOLHDL 2.8 10/08/2020   Lab Results  Component Value Date   HGBA1C 5.7 (H) 10/08/2020      Assessment & Plan:   Problem List Items Addressed This Visit       Endocrine   Hypothyroidism - Primary   Relevant Orders   TSH + free T4     Other   Obesity (BMI 30.0-34.9)   Prediabetes   Relevant Orders   Hemoglobin A1c    No orders of the defined types were placed in this encounter.   Follow-up: Return in about 6 months (around 01/09/2022) for Annual physical.    [name], MD

## 2021-07-10 NOTE — Telephone Encounter (Signed)
This has already been placed when she was here

## 2021-07-10 NOTE — Telephone Encounter (Signed)
Patient said needs a new referral to [facility] , the one they have on file has expired per patient.

## 2021-07-11 ENCOUNTER — Other Ambulatory Visit: Payer: Self-pay | Admitting: Internal Medicine

## 2021-07-11 DIAGNOSIS — E039 Hypothyroidism, unspecified: Secondary | ICD-10-CM

## 2021-07-11 LAB — TSH+FREE T4
Free T4: 1.37 ng/dL (ref 0.82–1.77)
TSH: 5.97 u[IU]/mL — ABNORMAL HIGH (ref 0.450–4.500)

## 2021-07-11 LAB — HEMOGLOBIN A1C
Est. average glucose Bld gHb Est-mCnc: 117 mg/dL
Hgb A1c MFr Bld: 5.7 % — ABNORMAL HIGH (ref 4.8–5.6)

## 2021-07-11 MED ORDER — LEVOTHYROXINE SODIUM 88 MCG PO TABS: 88.0000 ug | ORAL_TABLET | Freq: Every day | ORAL | 1 refills | Status: DC

## 2021-12-19 ENCOUNTER — Other Ambulatory Visit: Payer: Self-pay | Admitting: Internal Medicine

## 2021-12-19 DIAGNOSIS — Z1231 Encounter for screening mammogram for malignant neoplasm of breast: Secondary | ICD-10-CM

## 2021-12-23 ENCOUNTER — Inpatient Hospital Stay: Admission: RE | Admit: 2021-12-23 | Payer: BC Managed Care – PPO | Source: Ambulatory Visit

## 2022-01-14 ENCOUNTER — Encounter: Payer: BC Managed Care – PPO | Admitting: Internal Medicine

## 2022-01-14 ENCOUNTER — Encounter: Payer: Self-pay | Admitting: Internal Medicine

## 2022-01-22 ENCOUNTER — Ambulatory Visit: Payer: BC Managed Care – PPO | Admitting: Internal Medicine

## 2022-02-07 ENCOUNTER — Encounter: Payer: Self-pay | Admitting: Internal Medicine

## 2022-02-07 ENCOUNTER — Ambulatory Visit: Payer: BC Managed Care – PPO | Admitting: Internal Medicine

## 2022-02-07 VITALS — BP 111/73 | HR 87 | Ht 66.0 in | Wt 180.2 lb

## 2022-02-07 DIAGNOSIS — I493 Ventricular premature depolarization: Secondary | ICD-10-CM

## 2022-02-07 DIAGNOSIS — R002 Palpitations: Secondary | ICD-10-CM

## 2022-02-07 DIAGNOSIS — R42 Dizziness and giddiness: Secondary | ICD-10-CM | POA: Diagnosis not present

## 2022-02-07 DIAGNOSIS — E039 Hypothyroidism, unspecified: Secondary | ICD-10-CM

## 2022-02-07 DIAGNOSIS — Z1211 Encounter for screening for malignant neoplasm of colon: Secondary | ICD-10-CM

## 2022-02-07 DIAGNOSIS — F411 Generalized anxiety disorder: Secondary | ICD-10-CM | POA: Diagnosis not present

## 2022-02-07 MED ORDER — HYDROXYZINE HCL 10 MG PO TABS: 10.0000 mg | ORAL_TABLET | Freq: Two times a day (BID) | ORAL | 1 refills | Status: AC | PRN

## 2022-02-07 MED ORDER — MECLIZINE HCL 25 MG PO TABS: 25.0000 mg | ORAL_TABLET | Freq: Two times a day (BID) | ORAL | 0 refills | Status: DC | PRN

## 2022-02-07 NOTE — Progress Notes (Signed)
Acute Office Visit  Subjective:    Patient ID: Erin Forbes, female    DOB: [birth year], 52 y.o.   MRN: [id number]  Chief Complaint  Patient presents with   Dizziness    Rolled over one night and was really dizzy. Still having dizziness on and off and feeling anxious     HPI Patient is in today for complaint of dizziness that started about a week ago while she was trying to roll over at nighttime.  She had room spinning sensation.  She has had intermittent dizziness with head movement since then.  She had mild nausea, but has resolved now.  Denies any visual disturbance.  Denies any recent alcohol intake.  She also reports palpitations, which is intermittent.  Denies any chest pain or dyspnea.  Of note, she has been stressed lately since moving to a new home.  Her daughter has also moved in with her, which has made her more anxious.  She has insomnia as well.  She denies apathy, SI or HI currently.  She has stopped taking levothyroxine since 09/23 as she was having rash (?) with it.  Denies any recent change in weight or appetite.  Past Medical History:  Diagnosis Date   Headache 2000    Past Surgical History:  Procedure Laterality Date   BREAST REDUCTION SURGERY Bilateral 10/30/2014   Procedure: BREAST REDUCTION WITH LIPOSUCTION;  Surgeon: [name], MD;  Location: [place];  Service: Plastics;  Laterality: Bilateral;   BREAST SURGERY N/A    [facility]reesia 10/18/2019   CESAREAN SECTION  1995, 2002   two   CESAREAN SECTION N/A    Phreesia 10/18/2019   CHOLECYSTECTOMY     MASS EXCISION Bilateral 10/30/2014   Procedure: BILATERAL EXCISION ACCESSORY BREAST TISSUE;  Surgeon: [name], MD;  Location: [place];  Service: Plastics;  Laterality: Bilateral;   REDUCTION MAMMAPLASTY Bilateral     Family History  Problem Relation Age of Onset   Stroke Mother 61   Hypertension Mother    Hypertension Father    Hypertension Sister     Hypertension Brother    Breast cancer Paternal Grandmother     Social History   Socioeconomic History   Marital status: Single    Spouse name: Not on file   Number of children: Not on file   Years of education: Not on file   Highest education level: Not on file  Occupational History    Employer: [employer]  Tobacco Use   Smoking status: Former    Types: Cigarettes   Smokeless tobacco: Never   Tobacco comments:    Quit 19 years ago, occasionally used to smoke 1 cigarette, smoked for about 2 years  Vaping Use   Vaping Use: Never used  Substance and Sexual Activity   Alcohol use: Yes    Comment: occ   Drug use: No   Sexual activity: Not Currently    Birth control/protection: None  Other Topics Concern   Not on file  Social History Narrative   Not on file   Social Determinants of Health   Financial Resource Strain: Medium Risk (12/06/2019)   Overall Financial Resource Strain (CARDIA)    Difficulty of Paying Living Expenses: Somewhat hard  Food Insecurity: Food Insecurity Present (12/06/2019)   Hunger Vital Sign    Worried About Running Out of Food in the Last Year: Sometimes true    Ran Out of Food in the Last Year: Never true  Transportation  Needs: No Transportation Needs (12/06/2019)   PRAPARE - Hydrologist (Medical): No    Lack of Transportation (Non-Medical): No  Physical Activity: Insufficiently Active (12/06/2019)   Exercise Vital Sign    Days of Exercise per Week: 2 days    Minutes of Exercise per Session: 20 min  Stress: No Stress Concern Present (12/06/2019)   [place]    Feeling of Stress : Only a little  Social Connections: Unknown (12/06/2019)   Social Connection and Isolation Panel [NHANES]    Frequency of Communication with Friends and Family: Three times a week    Frequency of Social Gatherings with Friends and Family: More than three times a week     Attends Religious Services: Patient refused    Active Member of Clubs or Organizations: Yes    Attends Archivist Meetings: 1 to 4 times per year    Marital Status: Never married  Intimate Partner Violence: Not At Risk (12/06/2019)   Humiliation, Afraid, Rape, and Kick questionnaire    Fear of Current or Ex-Partner: No    Emotionally Abused: No    Physically Abused: No    Sexually Abused: No    Outpatient Medications Prior to Visit  Medication Sig Dispense Refill   levothyroxine (SYNTHROID) 88 MCG tablet Take 1 tablet (88 mcg total) by mouth daily before breakfast. (Patient not taking: Reported on 02/07/2022) 90 tablet 1   No facility-administered medications prior to visit.    Allergies  Allergen Reactions   Prednisone Rash    Review of Systems  Constitutional:  Negative for chills and fever.  HENT:  Negative for congestion, sinus pressure, sinus pain and sore throat.   Eyes:  Negative for pain and discharge.  Respiratory:  Negative for cough and shortness of breath.   Cardiovascular:  Positive for palpitations. Negative for chest pain.  Gastrointestinal:  Positive for constipation. Negative for abdominal pain, nausea and vomiting.  Endocrine: Negative for polydipsia and polyuria.  Genitourinary:  Negative for dysuria and hematuria.  Musculoskeletal:  Negative for neck pain and neck stiffness.  Skin:  Negative for rash.  Neurological:  Positive for dizziness. Negative for weakness.  Psychiatric/Behavioral:  Positive for sleep disturbance. Negative for agitation and behavioral problems. The patient is nervous/anxious.        Objective:    Physical Exam Vitals reviewed.  Constitutional:      General: She is not in acute distress.    Appearance: She is obese. She is not diaphoretic.  HENT:     Head: Normocephalic and atraumatic.     Nose: Nose normal. No congestion.     Mouth/Throat:     Mouth: Mucous membranes are moist.     Pharynx: No posterior  oropharyngeal erythema.  Eyes:     General: No scleral icterus.    Extraocular Movements: Extraocular movements intact.  Cardiovascular:     Rate and Rhythm: Normal rate and regular rhythm.     Pulses: Normal pulses.     Heart sounds: Normal heart sounds. No murmur heard. Pulmonary:     Breath sounds: Normal breath sounds. No wheezing or rales.  Musculoskeletal:     Cervical back: Neck supple. No tenderness.     Right lower leg: No edema.     Left lower leg: No edema.  Skin:    General: Skin is warm.     Findings: No rash.  Neurological:     General: No  focal deficit present.     Mental Status: She is alert and oriented to person, place, and time.     Sensory: No sensory deficit.     Motor: No weakness.  Psychiatric:        Mood and Affect: Mood normal.        Behavior: Behavior normal.     BP 111/73   Pulse 87   Ht '5\' 6"'$  (1.676 m)   Wt 180 lb 3.2 oz (81.7 kg)   SpO2 98%   BMI 29.09 kg/m  Wt Readings from Last 3 Encounters:  02/07/22 180 lb 3.2 oz (81.7 kg)  07/10/21 189 lb 12.8 oz (86.1 kg)  01/09/21 184 lb 1.9 oz (83.5 kg)        Assessment & Plan:   Problem List Items Addressed This Visit       Endocrine   Hypothyroidism    Lab Results  Component Value Date   TSH 5.970 (H) 07/10/2021  Has stopped taking Levothyroxine to 88 mcg QD Check TSH and free T4 and will decide dose accordingly      Relevant Orders   TSH + free T4   CMP14+EGFR     Other   GAD (generalized anxiety disorder)    Likely due to recent change in her surrounding Hydroxyzine as needed for anxiety      Relevant Medications   hydrOXYzine (ATARAX) 10 MG tablet   Vertigo - Primary    Dizziness could be due to vertigo, now improved Meclizine as needed for dizziness Avoid sudden positional changes Maintain adequate hydration      Relevant Medications   meclizine (ANTIVERT) 25 MG tablet   Palpitations    EKG: Sinus rhythm.  Frequent ventricular ectopic beats.  Unclear if  palpitations are related to VECs. Referred to Cardiology      Relevant Orders   EKG 12-Lead (Completed)   Ambulatory referral to Cardiology   Other Visit Diagnoses     Screening for colon cancer       Relevant Orders   Ambulatory referral to Gastroenterology   Ventricular ectopic beats       Relevant Orders   Ambulatory referral to Cardiology        Meds ordered this encounter  Medications   meclizine (ANTIVERT) 25 MG tablet    Sig: Take 1 tablet (25 mg total) by mouth 2 (two) times daily as needed for dizziness.    Dispense:  30 tablet    Refill:  0   hydrOXYzine (ATARAX) 10 MG tablet    Sig: Take 1 tablet (10 mg total) by mouth 2 (two) times daily as needed for anxiety.    Dispense:  30 tablet    Refill:  1     Lynnleigh Soden Keith Rake, MD

## 2022-02-07 NOTE — Patient Instructions (Signed)
Please take Meclizine as needed for dizziness.  Please take Hydroxyzine as needed for anxiety.  You are being referred to Cardiology.  Please get blood tests done in the next week.

## 2022-02-07 NOTE — Assessment & Plan Note (Signed)
Likely due to recent change in her surrounding Hydroxyzine as needed for anxiety

## 2022-02-07 NOTE — Assessment & Plan Note (Signed)
Lab Results  Component Value Date   TSH 5.970 (H) 07/10/2021   Has stopped taking Levothyroxine to 88 mcg QD Check TSH and free T4 and will decide dose accordingly

## 2022-02-07 NOTE — Assessment & Plan Note (Signed)
Dizziness could be due to vertigo, now improved Meclizine as needed for dizziness Avoid sudden positional changes Maintain adequate hydration

## 2022-02-07 NOTE — Assessment & Plan Note (Signed)
EKG: Sinus rhythm.  Frequent ventricular ectopic beats.  Unclear if palpitations are related to VECs. Referred to Cardiology

## 2022-02-10 DIAGNOSIS — E039 Hypothyroidism, unspecified: Secondary | ICD-10-CM | POA: Diagnosis not present

## 2022-02-11 ENCOUNTER — Other Ambulatory Visit: Payer: Self-pay | Admitting: Internal Medicine

## 2022-02-11 DIAGNOSIS — E039 Hypothyroidism, unspecified: Secondary | ICD-10-CM

## 2022-02-11 LAB — CMP14+EGFR
ALT: 11 IU/L (ref 0–32)
AST: 14 IU/L (ref 0–40)
Albumin/Globulin Ratio: 1.7 (ref 1.2–2.2)
Albumin: 4.2 g/dL (ref 3.8–4.9)
Alkaline Phosphatase: 121 IU/L (ref 44–121)
BUN/Creatinine Ratio: 10 (ref 9–23)
BUN: 8 mg/dL (ref 6–24)
Bilirubin Total: 0.3 mg/dL (ref 0.0–1.2)
CO2: 21 mmol/L (ref 20–29)
Calcium: 9.3 mg/dL (ref 8.7–10.2)
Chloride: 106 mmol/L (ref 96–106)
Creatinine, Ser: 0.83 mg/dL (ref 0.57–1.00)
Globulin, Total: 2.5 g/dL (ref 1.5–4.5)
Glucose: 106 mg/dL — ABNORMAL HIGH (ref 70–99)
Potassium: 4.2 mmol/L (ref 3.5–5.2)
Sodium: 142 mmol/L (ref 134–144)
Total Protein: 6.7 g/dL (ref 6.0–8.5)
eGFR: 85 mL/min/{1.73_m2} (ref >59)

## 2022-02-11 LAB — TSH+FREE T4
Free T4: 1.19 ng/dL (ref 0.82–1.77)
TSH: 8.55 u[IU]/mL — ABNORMAL HIGH (ref 0.450–4.500)

## 2022-02-11 MED ORDER — LEVOTHYROXINE SODIUM 25 MCG PO TABS: 25.0000 ug | ORAL_TABLET | Freq: Every day | ORAL | 1 refills | Status: DC

## 2022-02-12 ENCOUNTER — Encounter: Payer: Self-pay | Admitting: *Deleted

## 2022-02-26 NOTE — Progress Notes (Deleted)
CARDIOLOGY CONSULT NOTE       Patient ID: Erin Forbes MRN: [id number] DOB/AGE: 1970-12-09 52 y.o.  Admit date: (Not on file) Referring Physician: [name] Primary Physician: [name], MD Primary Cardiologist: New Reason for Consultation: Dizziness/PVC/Palpitations  Active Problems:   * No active hospital problems. *   HPI:  52 y.o. referred by Dr [name] for dizziness, PVCls and palpitations. History of headaches, anxiety Seen by primary 02/07/22 Complained of dizziness rolling over in bed at night Then had persistent sensation of dizziness with anxiety Did have vertiginous component with room spinning and nausea Intermittent benign sounding palpitations No chest pain dyspnea or syncope Stressed recently moving into new home with daughter living with her Has had insomnia Given Atarax PRN for anxiety and meclizine for dizziness   ECG in office with SR rate 80 3 PVCls normal QT and otherwise norma  Lab review normal renal function K 4.2 TSH 8.55 up from 5.9 on 07/10/21 with normal T4 A1c 5.7 Last CBC 10/08/20 was stable 37.1   ROS All other systems reviewed and negative except as noted above  Past Medical History:  Diagnosis Date   Headache 2000    Family History  Problem Relation Age of Onset   Stroke Mother 54   Hypertension Mother    Hypertension Father    Hypertension Sister    Hypertension Brother    Breast cancer Paternal Grandmother     Social History   Socioeconomic History   Marital status: Single    Spouse name: Not on file   Number of children: Not on file   Years of education: Not on file   Highest education level: Not on file  Occupational History    Employer: [employer]  Tobacco Use   Smoking status: Former    Types: Cigarettes   Smokeless tobacco: Never   Tobacco comments:    Quit 19 years ago, occasionally used to smoke 1 cigarette, smoked for about 2 years  Vaping Use   Vaping Use: Never used  Substance and Sexual Activity   Alcohol  use: Yes    Comment: occ   Drug use: No   Sexual activity: Not Currently    Birth control/protection: None  Other Topics Concern   Not on file  Social History Narrative   Not on file   Social Determinants of Health   Financial Resource Strain: Medium Risk (12/06/2019)   Overall Financial Resource Strain (CARDIA)    Difficulty of Paying Living Expenses: Somewhat hard  Food Insecurity: Food Insecurity Present (12/06/2019)   Hunger Vital Sign    Worried About Running Out of Food in the Last Year: Sometimes true    Ran Out of Food in the Last Year: Never true  Transportation Needs: No Transportation Needs (12/06/2019)   PRAPARE - Hydrologist (Medical): No    Lack of Transportation (Non-Medical): No  Physical Activity: Insufficiently Active (12/06/2019)   Exercise Vital Sign    Days of Exercise per Week: 2 days    Minutes of Exercise per Session: 20 min  Stress: No Stress Concern Present (12/06/2019)   [place]    Feeling of Stress : Only a little  Social Connections: Unknown (12/06/2019)   Social Connection and Isolation Panel [NHANES]    Frequency of Communication with Friends and Family: Three times a week    Frequency of Social Gatherings with Friends and Family: More than three times  a week    Attends Religious Services: Patient refused    Active Member of Clubs or Organizations: Yes    Attends Archivist Meetings: 1 to 4 times per year    Marital Status: Never married  Intimate Partner Violence: Not At Risk (12/06/2019)   Humiliation, Afraid, Rape, and Kick questionnaire    Fear of Current or Ex-Partner: No    Emotionally Abused: No    Physically Abused: No    Sexually Abused: No    Past Surgical History:  Procedure Laterality Date   BREAST REDUCTION SURGERY Bilateral 10/30/2014   Procedure: BREAST REDUCTION WITH LIPOSUCTION;  Surgeon: [name], MD;   Location: [place] [facility];  Service: Plastics;  Laterality: Bilateral;   BREAST SURGERY N/A    [facility]reesia 10/18/2019   CESAREAN SECTION  1995, 2002   two   CESAREAN SECTION N/A    [employer]reesia 10/18/2019   CHOLECYSTECTOMY     MASS EXCISION Bilateral 10/30/2014   Procedure: BILATERAL EXCISION ACCESSORY BREAST TISSUE;  Surgeon: [name], MD;  Location: [place];  Service: Plastics;  Laterality: Bilateral;   REDUCTION MAMMAPLASTY Bilateral       Current Outpatient Medications:    hydrOXYzine (ATARAX) 10 MG tablet, Take 1 tablet (10 mg total) by mouth 2 (two) times daily as needed for anxiety., Disp: 30 tablet, Rfl: 1   levothyroxine (SYNTHROID) 25 MCG tablet, Take 1 tablet (25 mcg total) by mouth daily., Disp: 90 tablet, Rfl: 1   meclizine (ANTIVERT) 25 MG tablet, Take 1 tablet (25 mg total) by mouth 2 (two) times daily as needed for dizziness., Disp: 30 tablet, Rfl: 0    Physical Exam: There were no vitals taken for this visit.    Affect appropriate Healthy:  appears stated age 41: normal Neck supple with no adenopathy JVP normal no bruits no thyromegaly Lungs clear with no wheezing and good diaphragmatic motion Heart:  S1/S2 no murmur, no rub, gallop or click PMI normal Abdomen: benighn, BS positve, no tenderness, no AAA no bruit.  No HSM or HJR Distal pulses intact with no bruits No edema Neuro non-focal Skin warm and dry No muscular weakness   Labs:   Lab Results  Component Value Date   WBC 7.6 10/08/2020   HGB 11.9 10/08/2020   HCT 37.1 10/08/2020   MCV 83 10/08/2020   PLT 243 10/08/2020   No results for input(s): "NA", "K", "CL", "CO2", "BUN", "CREATININE", "CALCIUM", "PROT", "BILITOT", "ALKPHOS", "ALT", "AST", "GLUCOSE" in the last 168 hours.  Invalid input(s): "LABALBU" Lab Results  Component Value Date   CKTOTAL 101 07/18/2011   CKMB 1.1 07/18/2011   TROPONINI <0.30 07/18/2011    Lab Results  Component Value Date    CHOL 112 10/08/2020   CHOL 118 12/06/2019   CHOL 97 (L) 04/26/2015   Lab Results  Component Value Date   HDL 40 10/08/2020   HDL 44 12/06/2019   HDL 34 (L) 04/26/2015   Lab Results  Component Value Date   LDLCALC 54 10/08/2020   LDLCALC 58 12/06/2019   LDLCALC 40 04/26/2015   Lab Results  Component Value Date   TRIG 94 10/08/2020   TRIG 77 12/06/2019   TRIG 114 04/26/2015   Lab Results  Component Value Date   CHOLHDL 2.8 10/08/2020   CHOLHDL 2.7 12/06/2019   CHOLHDL 2.9 04/26/2015   No results found for: "LDLDIRECT"    Radiology: No results found.  EKG: See HPI  ***   ASSESSMENT  AND PLAN:   Dizziness: clearly sounds like vertigo has meclizine consider vestibular rehab if persists Anxiety: situational from move/daughter Atarax per primary  PVC;s likely benign and not related to symptoms Favor following w/u. 48 hour holter to quantify. TTE to r/o structural heart Dx.  ECG other wise normal Coronary calcium score  Thyroid :  continue synthroid replacement consider increasing dose with more elevated TSH  Calcium score TTE 48 hour holter   F/U cardiology PRN   Signed: Jenkins Rouge 02/26/2022, 3:47 PM

## 2022-03-07 ENCOUNTER — Ambulatory Visit: Payer: BC Managed Care – PPO | Admitting: Cardiovascular Disease

## 2022-03-19 ENCOUNTER — Telehealth (INDEPENDENT_AMBULATORY_CARE_PROVIDER_SITE_OTHER): Payer: Self-pay | Admitting: Gastroenterology

## 2022-03-19 DIAGNOSIS — Z1211 Encounter for screening for malignant neoplasm of colon: Secondary | ICD-10-CM

## 2022-03-19 NOTE — Telephone Encounter (Signed)
Any room Thanks

## 2022-03-19 NOTE — Telephone Encounter (Signed)
Who is your primary care physician: Dr.Rutwik Patel  Reasons for the colonoscopy: Screening  Have you had a colonoscopy before?  No  Do you have family history of colon cancer? Yes; grandfather  Previous colonoscopy with polyps removed? No  Do you have a history colorectal cancer?   No  Are you diabetic? If yes, Type 1 or Type 2?    No  Do you have a prosthetic or mechanical heart valve? No  Do you have a pacemaker/defibrillator?   No  Have you had endocarditis/atrial fibrillation? No  Have you had joint replacement within the last 12 months?  No  Do you tend to be constipated or have to use laxatives? No  Do you have any history of drugs or alchohol?  No  Do you use supplemental oxygen?  No  Have you had a stroke or heart attack within the last 6 months? No  Do you take weight loss medication? No  For female patients: have you had a hysterectomy?  No                                     are you post menopausal?                                                   do you still have your menstrual cycle?  Last Menstrual Period 11/04/2021      Do you take any blood-thinning medications such as: (aspirin, warfarin, Plavix, Aggrenox)  No  If yes we need the name, milligram, dosage and who is prescribing doctor  Current Outpatient Medications on File Prior to Visit  Medication Sig Dispense Refill   hydrOXYzine (ATARAX) 10 MG tablet Take 1 tablet (10 mg total) by mouth 2 (two) times daily as needed for anxiety. 30 tablet 1   levothyroxine (SYNTHROID) 25 MCG tablet Take 1 tablet (25 mcg total) by mouth daily. 90 tablet 1   meclizine (ANTIVERT) 25 MG tablet Take 1 tablet (25 mg total) by mouth 2 (two) times daily as needed for dizziness. 30 tablet 0   No current facility-administered medications on file prior to visit.    Allergies  Allergen Reactions   Prednisone Rash     Pharmacy: [facility]  Primary Insurance Name: BCBS  Best number where you can be reached:  [phone]

## 2022-03-20 MED ORDER — PEG 3350-KCL-NA BICARB-NACL 420 G PO SOLR: 4000.0000 mL | Freq: Once | ORAL | 0 refills | Status: AC

## 2022-03-20 NOTE — Addendum Note (Signed)
Addended by: Vicente Males on: 03/20/2022 03:47 PM   Modules accepted: Orders

## 2022-03-20 NOTE — Telephone Encounter (Signed)
Pt contacted and scheduled for TCS 04/16/22. Instruction sent via my chart. Prep sent to pharmacy.

## 2022-03-24 NOTE — Telephone Encounter (Signed)
Referral completed

## 2022-03-28 ENCOUNTER — Encounter: Payer: Self-pay | Admitting: Internal Medicine

## 2022-04-09 ENCOUNTER — Encounter: Payer: Self-pay | Admitting: Internal Medicine

## 2022-04-09 ENCOUNTER — Ambulatory Visit (INDEPENDENT_AMBULATORY_CARE_PROVIDER_SITE_OTHER): Payer: BC Managed Care – PPO | Admitting: Internal Medicine

## 2022-04-09 VITALS — BP 120/76 | HR 93 | Ht 65.0 in | Wt 183.2 lb

## 2022-04-09 DIAGNOSIS — E039 Hypothyroidism, unspecified: Secondary | ICD-10-CM

## 2022-04-09 DIAGNOSIS — Z0001 Encounter for general adult medical examination with abnormal findings: Secondary | ICD-10-CM

## 2022-04-09 DIAGNOSIS — R7303 Prediabetes: Secondary | ICD-10-CM

## 2022-04-09 DIAGNOSIS — E559 Vitamin D deficiency, unspecified: Secondary | ICD-10-CM

## 2022-04-09 DIAGNOSIS — F411 Generalized anxiety disorder: Secondary | ICD-10-CM

## 2022-04-09 DIAGNOSIS — E669 Obesity, unspecified: Secondary | ICD-10-CM | POA: Diagnosis not present

## 2022-04-09 DIAGNOSIS — E782 Mixed hyperlipidemia: Secondary | ICD-10-CM

## 2022-04-09 NOTE — Patient Instructions (Signed)
Please continue to take medications as prescribed. ? ?Please continue to follow low carb diet and perform moderate exercise/walking at least 150 mins/week. ?

## 2022-04-09 NOTE — Assessment & Plan Note (Signed)
Lab Results  Component Value Date   HGBA1C 5.7 (H) 07/10/2021   Advised to follow low carbohydrate diet

## 2022-04-09 NOTE — Assessment & Plan Note (Signed)
Likely due to recent change in her surrounding Hydroxyzine as needed for anxiety 

## 2022-04-09 NOTE — Assessment & Plan Note (Signed)

## 2022-04-09 NOTE — Assessment & Plan Note (Signed)
Lab Results  Component Value Date   TSH 8.550 (H) 02/10/2022   On Levothyroxine 25 mcg QD Check TSH and free T4 and will decide dose accordingly

## 2022-04-09 NOTE — Assessment & Plan Note (Signed)
Advised to take vitamin D supplements Check vitamin D level 

## 2022-04-09 NOTE — Progress Notes (Signed)
Established Patient Office Visit  Subjective:  Patient ID: Erin Forbes, female    DOB: 1970/12/15  Age: 52 y.o. MRN: [id number]  CC:  Chief Complaint  Patient presents with   Annual Exam    Patient would like to discuss weight loss options.    HPI Erin Forbes is a 52 y.o. female with past medical history of hypothyroidism, constipation, seasonal allergies and breast surgery for macromastia who presents for annual physical.  Hypothyroidism: She has started taking levothyroxine regularly now.  Denies any recent change in weight or appetite currently.  She reports that her dizziness, palpitations and anxiety have improved as well.  She is interested in medical weight loss.  I had lengthy discussion with her about GLP1 agonist therapy.  I explained to her that her thyroid profile needs to be under control before considering other medical weight loss options, she expressed understanding.      Past Medical History:  Diagnosis Date   Headache 2000    Past Surgical History:  Procedure Laterality Date   BREAST REDUCTION SURGERY Bilateral 10/30/2014   Procedure: BREAST REDUCTION WITH LIPOSUCTION;  Surgeon: [name], MD;  Location: [place];  Service: Plastics;  Laterality: Bilateral;   BREAST SURGERY N/A    [facility]reesia 10/18/2019   CESAREAN SECTION  1995, 2002   two   CESAREAN SECTION N/A    Phreesia 10/18/2019   CHOLECYSTECTOMY     MASS EXCISION Bilateral 10/30/2014   Procedure: BILATERAL EXCISION ACCESSORY BREAST TISSUE;  Surgeon: [name], MD;  Location: [place];  Service: Plastics;  Laterality: Bilateral;   REDUCTION MAMMAPLASTY Bilateral     Family History  Problem Relation Age of Onset   Stroke Mother 65   Hypertension Mother    Hypertension Father    Hypertension Sister    Hypertension Brother    Breast cancer Paternal Grandmother     Social History   Socioeconomic History   Marital status: Single     Spouse name: Not on file   Number of children: Not on file   Years of education: Not on file   Highest education level: Not on file  Occupational History    Employer: [employer]  Tobacco Use   Smoking status: Former    Types: Cigarettes   Smokeless tobacco: Never   Tobacco comments:    Quit 19 years ago, occasionally used to smoke 1 cigarette, smoked for about 2 years  Vaping Use   Vaping Use: Never used  Substance and Sexual Activity   Alcohol use: Yes    Comment: occ   Drug use: No   Sexual activity: Not Currently    Birth control/protection: None  Other Topics Concern   Not on file  Social History Narrative   Not on file   Social Determinants of Health   Financial Resource Strain: Medium Risk (12/06/2019)   Overall Financial Resource Strain (CARDIA)    Difficulty of Paying Living Expenses: Somewhat hard  Food Insecurity: Food Insecurity Present (12/06/2019)   Hunger Vital Sign    Worried About [place] in the Last Year: Sometimes true    Ran Out of Food in the Last Year: Never true  Transportation Needs: No Transportation Needs (12/06/2019)   PRAPARE - Hydrologist (Medical): No    Lack of Transportation (Non-Medical): No  Physical Activity: Insufficiently Active (12/06/2019)   Exercise Vital Sign    Days of Exercise per Week:  2 days    Minutes of Exercise per Session: 20 min  Stress: No Stress Concern Present (12/06/2019)   [place]    Feeling of Stress : Only a little  Social Connections: Unknown (12/06/2019)   Social Connection and Isolation Panel [NHANES]    Frequency of Communication with Friends and Family: Three times a week    Frequency of Social Gatherings with Friends and Family: More than three times a week    Attends Religious Services: Patient declined    Active Member of Clubs or Organizations: Yes    Attends Archivist Meetings:  1 to 4 times per year    Marital Status: Never married  Intimate Partner Violence: Not At Risk (12/06/2019)   Humiliation, Afraid, Rape, and Kick questionnaire    Fear of Current or Ex-Partner: No    Emotionally Abused: No    Physically Abused: No    Sexually Abused: No    Outpatient Medications Prior to Visit  Medication Sig Dispense Refill   hydrOXYzine (ATARAX) 10 MG tablet Take 1 tablet (10 mg total) by mouth 2 (two) times daily as needed for anxiety. 30 tablet 1   levothyroxine (SYNTHROID) 25 MCG tablet Take 1 tablet (25 mcg total) by mouth daily. 90 tablet 1   meclizine (ANTIVERT) 25 MG tablet Take 1 tablet (25 mg total) by mouth 2 (two) times daily as needed for dizziness. 30 tablet 0   No facility-administered medications prior to visit.    Allergies  Allergen Reactions   Penicillins    Prednisone Rash    ROS Review of Systems  Constitutional:  Negative for chills and fever.  HENT:  Negative for congestion, sinus pressure, sinus pain and sore throat.   Eyes:  Negative for pain and discharge.  Respiratory:  Negative for cough and shortness of breath.   Cardiovascular:  Negative for chest pain and palpitations.  Gastrointestinal:  Positive for constipation. Negative for abdominal pain, nausea and vomiting.  Endocrine: Negative for polydipsia and polyuria.  Genitourinary:  Negative for dysuria and hematuria.  Musculoskeletal:  Negative for neck pain and neck stiffness.  Skin:  Negative for rash.  Neurological:  Negative for dizziness and weakness.  Psychiatric/Behavioral:  Negative for agitation, behavioral problems and sleep disturbance. The patient is nervous/anxious.       Objective:    Physical Exam Vitals reviewed.  Constitutional:      General: She is not in acute distress.    Appearance: She is obese. She is not diaphoretic.  HENT:     Head: Normocephalic and atraumatic.     Nose: Nose normal. No congestion.     Mouth/Throat:     Mouth: Mucous  membranes are moist.     Pharynx: No posterior oropharyngeal erythema.  Eyes:     General: No scleral icterus.    Extraocular Movements: Extraocular movements intact.  Cardiovascular:     Rate and Rhythm: Normal rate and regular rhythm.     Pulses: Normal pulses.     Heart sounds: Normal heart sounds. No murmur heard. Pulmonary:     Breath sounds: Normal breath sounds. No wheezing or rales.  Abdominal:     Palpations: Abdomen is soft.     Tenderness: There is no abdominal tenderness.  Musculoskeletal:     Cervical back: Neck supple. No tenderness.     Right lower leg: No edema.     Left lower leg: No edema.  Skin:  General: Skin is warm.     Findings: No rash.  Neurological:     General: No focal deficit present.     Mental Status: She is alert and oriented to person, place, and time.     Cranial Nerves: No cranial nerve deficit.     Sensory: No sensory deficit.     Motor: No weakness.  Psychiatric:        Mood and Affect: Mood normal.        Behavior: Behavior normal.     BP 120/76 (BP Location: Left Arm, Cuff Size: Normal)   Pulse 93   Ht 5\' 5"  (1.651 m)   Wt 183 lb 3.2 oz (83.1 kg)   SpO2 96%   BMI 30.49 kg/m  Wt Readings from Last 3 Encounters:  04/09/22 183 lb 3.2 oz (83.1 kg)  02/07/22 180 lb 3.2 oz (81.7 kg)  07/10/21 189 lb 12.8 oz (86.1 kg)    Lab Results  Component Value Date   TSH 8.550 (H) 02/10/2022   Lab Results  Component Value Date   WBC 7.6 10/08/2020   HGB 11.9 10/08/2020   HCT 37.1 10/08/2020   MCV 83 10/08/2020   PLT 243 10/08/2020   Lab Results  Component Value Date   NA 142 02/10/2022   K 4.2 02/10/2022   CO2 21 02/10/2022   GLUCOSE 106 (H) 02/10/2022   BUN 8 02/10/2022   CREATININE 0.83 02/10/2022   BILITOT 0.3 02/10/2022   ALKPHOS 121 02/10/2022   AST 14 02/10/2022   ALT 11 02/10/2022   PROT 6.7 02/10/2022   ALBUMIN 4.2 02/10/2022   CALCIUM 9.3 02/10/2022   ANIONGAP 5 08/28/2018   EGFR 85 02/10/2022   Lab Results   Component Value Date   CHOL 112 10/08/2020   Lab Results  Component Value Date   HDL 40 10/08/2020   Lab Results  Component Value Date   LDLCALC 54 10/08/2020   Lab Results  Component Value Date   TRIG 94 10/08/2020   Lab Results  Component Value Date   CHOLHDL 2.8 10/08/2020   Lab Results  Component Value Date   HGBA1C 5.7 (H) 07/10/2021      Assessment & Plan:   Problem List Items Addressed This Visit       Endocrine   Hypothyroidism    Lab Results  Component Value Date   TSH 8.550 (H) 02/10/2022  On Levothyroxine 25 mcg QD Check TSH and free T4 and will decide dose accordingly      Relevant Orders   CMP14+EGFR   CBC with Differential/Platelet   TSH + free T4     Other   Obesity (BMI 30.0-34.9)    BMI Readings from Last 3 Encounters:  04/09/22 30.49 kg/m  02/07/22 29.09 kg/m  07/10/21 31.10 kg/m  Diet modification advised Moderate exercise/walking for 30 mins/day for 5 days in a week advised Will encourage in the subsequent visits Had discussion about GLP-1 agonist therapy, but would prefer to see thyroid profile improving before considering medical weight loss options      GAD (generalized anxiety disorder)    Likely due to recent change in her surrounding Hydroxyzine as needed for anxiety      Vitamin D deficiency    Advised to take vitamin D supplements Check vitamin D level      Relevant Orders   VITAMIN D 25 Hydroxy (Vit-D Deficiency, Fractures)   Prediabetes    Lab Results  Component Value Date   HGBA1C  5.7 (H) 07/10/2021  Advised to follow low carbohydrate diet      Relevant Orders   Hemoglobin A1c   Encounter for general adult medical examination with abnormal findings - Primary    Physical exam as documented. Counseling done  re healthy lifestyle involving commitment to 150 minutes exercise per week, heart healthy diet, and attaining healthy weight.The importance of adequate sleep also discussed. Changes in health habits  are decided on by the patient with goals and time frames  set for achieving them. Immunization and cancer screening needs are specifically addressed at this visit.      Other Visit Diagnoses     Mixed hyperlipidemia       Relevant Orders   Lipid panel       No orders of the defined types were placed in this encounter.   Follow-up: Return in about 3 months (around 07/10/2022) for Hypothyroidism and weight management.    [name], MD

## 2022-04-09 NOTE — Assessment & Plan Note (Signed)
BMI Readings from Last 3 Encounters:  04/09/22 30.49 kg/m  02/07/22 29.09 kg/m  07/10/21 31.10 kg/m   Diet modification advised Moderate exercise/walking for 30 mins/day for 5 days in a week advised Will encourage in the subsequent visits Had discussion about GLP-1 agonist therapy, but would prefer to see thyroid profile improving before considering medical weight loss options

## 2022-04-10 ENCOUNTER — Other Ambulatory Visit: Payer: Self-pay | Admitting: Internal Medicine

## 2022-04-10 DIAGNOSIS — E559 Vitamin D deficiency, unspecified: Secondary | ICD-10-CM

## 2022-04-10 LAB — LIPID PANEL
Chol/HDL Ratio: 3 ratio (ref 0.0–4.4)
Cholesterol, Total: 126 mg/dL (ref 100–199)
HDL: 42 mg/dL
LDL Chol Calc (NIH): 68 mg/dL (ref 0–99)
Triglycerides: 80 mg/dL (ref 0–149)
VLDL Cholesterol Cal: 16 mg/dL (ref 5–40)

## 2022-04-10 LAB — CMP14+EGFR
ALT: 10 [IU]/L (ref 0–32)
AST: 13 [IU]/L (ref 0–40)
Albumin/Globulin Ratio: 1.9 (ref 1.2–2.2)
Albumin: 4.5 g/dL (ref 3.8–4.9)
Alkaline Phosphatase: 107 [IU]/L (ref 44–121)
BUN/Creatinine Ratio: 11 (ref 9–23)
BUN: 7 mg/dL (ref 6–24)
Bilirubin Total: 0.6 mg/dL (ref 0.0–1.2)
CO2: 21 mmol/L (ref 20–29)
Calcium: 9.2 mg/dL (ref 8.7–10.2)
Chloride: 106 mmol/L (ref 96–106)
Creatinine, Ser: 0.62 mg/dL (ref 0.57–1.00)
Globulin, Total: 2.4 g/dL (ref 1.5–4.5)
Glucose: 93 mg/dL (ref 70–99)
Potassium: 4 mmol/L (ref 3.5–5.2)
Sodium: 143 mmol/L (ref 134–144)
Total Protein: 6.9 g/dL (ref 6.0–8.5)
eGFR: 108 mL/min/{1.73_m2}

## 2022-04-10 LAB — HEMOGLOBIN A1C
Est. average glucose Bld gHb Est-mCnc: 126 mg/dL
Hgb A1c MFr Bld: 6 % — ABNORMAL HIGH (ref 4.8–5.6)

## 2022-04-10 LAB — VITAMIN D 25 HYDROXY (VIT D DEFICIENCY, FRACTURES): Vit D, 25-Hydroxy: 8.6 ng/mL — ABNORMAL LOW (ref 30.0–100.0)

## 2022-04-10 LAB — CBC WITH DIFFERENTIAL/PLATELET
Basophils Absolute: 0 10*3/uL (ref 0.0–0.2)
Basos: 1 %
EOS (ABSOLUTE): 0.1 10*3/uL (ref 0.0–0.4)
Eos: 2 %
Hematocrit: 39.2 % (ref 34.0–46.6)
Hemoglobin: 12.9 g/dL (ref 11.1–15.9)
Immature Grans (Abs): 0 10*3/uL (ref 0.0–0.1)
Immature Granulocytes: 0 %
Lymphocytes Absolute: 2.6 10*3/uL (ref 0.7–3.1)
Lymphs: 34 %
MCH: 27.7 pg (ref 26.6–33.0)
MCHC: 32.9 g/dL (ref 31.5–35.7)
MCV: 84 fL (ref 79–97)
Monocytes Absolute: 0.5 10*3/uL (ref 0.1–0.9)
Monocytes: 7 %
Neutrophils Absolute: 4.3 10*3/uL (ref 1.4–7.0)
Neutrophils: 56 %
Platelets: 246 10*3/uL (ref 150–450)
RBC: 4.66 x10E6/uL (ref 3.77–5.28)
RDW: 14.7 % (ref 11.7–15.4)
WBC: 7.6 10*3/uL (ref 3.4–10.8)

## 2022-04-10 LAB — TSH+FREE T4
Free T4: 1.47 ng/dL (ref 0.82–1.77)
TSH: 4.91 u[IU]/mL — ABNORMAL HIGH (ref 0.450–4.500)

## 2022-04-10 MED ORDER — VITAMIN D (ERGOCALCIFEROL) 1.25 MG (50000 UNIT) PO CAPS: 50000.0000 [IU] | ORAL_CAPSULE | ORAL | 1 refills | Status: DC

## 2022-04-11 ENCOUNTER — Ambulatory Visit: Payer: BC Managed Care – PPO | Attending: Internal Medicine

## 2022-04-11 ENCOUNTER — Ambulatory Visit: Payer: BC Managed Care – PPO | Attending: Cardiovascular Disease | Admitting: Internal Medicine

## 2022-04-11 ENCOUNTER — Encounter: Payer: Self-pay | Admitting: Internal Medicine

## 2022-04-11 VITALS — BP 120/82 | HR 61 | Ht 65.5 in | Wt 185.0 lb

## 2022-04-11 DIAGNOSIS — R002 Palpitations: Secondary | ICD-10-CM

## 2022-04-11 DIAGNOSIS — I493 Ventricular premature depolarization: Secondary | ICD-10-CM | POA: Diagnosis not present

## 2022-04-11 NOTE — Patient Instructions (Signed)
Medication Instructions:  Your physician recommends that you continue on your current medications as directed. Please refer to the Current Medication list given to you today.  *If you need a refill on your cardiac medications before your next appointment, please call your pharmacy*   Lab Work: NONE   If you have labs (blood work) drawn today and your tests are completely normal, you will receive your results only by: [place] (if you have MyChart) OR A paper copy in the mail If you have any lab test that is abnormal or we need to change your treatment, we will call you to review the results.   Testing/Procedures: [place] [place]- Long Term Monitor Instructions   Your physician has requested you wear your ZIO patch monitor___7____days. You may remove on April 18, 2022.   This is a single patch monitor.  [employer] supplies one patch monitor per enrollment.  Additional stickers are not available.   Please do not apply patch if you will be having a Nuclear Stress Test, Echocardiogram, Cardiac CT, MRI, or Chest Xray during the time frame you would be wearing the monitor. The patch cannot be worn during these tests.  You cannot remove and re-apply the ZIO XT patch monitor.   Your ZIO patch monitor will be sent USPS Priority mail from [facility] directly to your home address. The monitor may also be mailed to a PO BOX if home delivery is not available.   It may take 3-5 days to receive your monitor after you have been enrolled.   Once you have received you monitor, please review enclosed instructions.  Your monitor has already been registered assigning a specific monitor serial # to you.   Applying the monitor   Shave hair from upper left chest.   Hold abrader disc by orange tab.  Rub abrader in 40 strokes over left upper chest as indicated in your monitor instructions.   Clean area with 4 enclosed alcohol pads .  Use all pads to assure are is cleaned thoroughly.  Let dry.   Apply  patch as indicated in monitor instructions.  Patch will be place under collarbone on left side of chest with arrow pointing upward.   Rub patch adhesive wings for 2 minutes.Remove white label marked "1".  Remove white label marked "2".  Rub patch adhesive wings for 2 additional minutes.   While looking in a mirror, press and release button in center of patch.  A small green light will flash 3-4 times .  This will be your only indicator the monitor has been turned on.     Do not shower for the first 24 hours.  You may shower after the first 24 hours.   Press button if you feel a symptom. You will hear a small click.  Record Date, Time and Symptom in the Patient Log Book.   When you are ready to remove patch, follow instructions on last 2 pages of Patient Log Book.  Stick patch monitor onto last page of Patient Log Book.   Place Patient Log Book in [place] box.  Use locking tab on box and tape box closed securely.  The [employer] and [employer] has [employer] on it.  Please place in mailbox as soon as possible.  Your physician should have your test results approximately 7 days after the monitor has been mailed back to [facility].   Call [place] at [phone] if you have questions regarding your ZIO XT patch monitor.  Call  them immediately if you see an orange light blinking on your monitor.   If your monitor falls off in less than 4 days contact our Monitor department at [phone].  If your monitor becomes loose or falls off after 4 days call [employer] at [phone] for suggestions on securing your monitor.     Follow-Up: At [facility], you and your health needs are our priority.  As part of our continuing mission to provide you with exceptional heart care, we have created designated Provider Care Teams.  These Care Teams include your primary Cardiologist (physician) and Advanced Practice Providers (APPs -  Physician Assistants and Nurse Practitioners) who  all work together to provide you with the care you need, when you need it.  We recommend signing up for the patient portal called "MyChart".  Sign up information is provided on this After Visit Summary.  MyChart is used to connect with patients for Virtual Visits (Telemedicine).  Patients are able to view lab/test results, encounter notes, upcoming appointments, etc.  Non-urgent messages can be sent to your provider as well.   To learn more about what you can do with MyChart, go to [email or web address].    Your next appointment:    Pending Results   Provider:   Claudina Lick, MD    Other Instructions Thank you for choosing [facility]!

## 2022-04-11 NOTE — Progress Notes (Signed)
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 [name]   History of Present Illness: Erin Forbes is a 52 y.o. female with hypothyroidism was referred to cardiology clinic for evaluation of PVCs.  Patient was diagnosed with hypothyroidism and has been on levothyroxine 75 mcg until October 2023 when she quit taking it completely due to palpitations. She continued to have palpitations, probably decreased in frequency as she did not pay attention to them.  Towards the end of January 2024, she was feeling dizzy and also having palpitations which was when she was started back on levothyroxine 25 mcg. Dizziness and palpitations resolved but she has occasional palpitations.  Denies other symptoms of chest pain, DOE, syncope, LE swelling.  Denies smoking cigarettes and alcohol use.  Past Medical History:  Diagnosis Date   Headache 2000    Past Surgical History:  Procedure Laterality Date   BREAST REDUCTION SURGERY Bilateral 10/30/2014   Procedure: BREAST REDUCTION WITH LIPOSUCTION;  Surgeon: [name], MD;  Location: [facility];  Service: Plastics;  Laterality: Bilateral;   BREAST SURGERY N/A    Phreesia 10/18/2019   CESAREAN SECTION  1995, 2002   two   CESAREAN SECTION N/A    Phreesia 10/18/2019   CHOLECYSTECTOMY     MASS EXCISION Bilateral 10/30/2014   Procedure: BILATERAL EXCISION ACCESSORY BREAST TISSUE;  Surgeon: [name], MD;  Location: [facility];  Service: Plastics;  Laterality: Bilateral;   REDUCTION MAMMAPLASTY Bilateral     Current Outpatient Medications  Medication Sig Dispense Refill   Vitamin D, Ergocalciferol, (DRISDOL) 1.25 MG (50000 UNIT) CAPS capsule Take 1 capsule (50,000 Units total) by mouth every 7 (seven) days. 12  capsule 1   hydrOXYzine (ATARAX) 10 MG tablet Take 1 tablet (10 mg total) by mouth 2 (two) times daily as needed for anxiety. 30 tablet 1   levothyroxine (SYNTHROID) 25 MCG tablet Take 1 tablet (25 mcg total) by mouth daily. 90 tablet 1   meclizine (ANTIVERT) 25 MG tablet Take 1 tablet (25 mg total) by mouth 2 (two) times daily as needed for dizziness. 30 tablet 0   No current facility-administered medications for this visit.   Allergies:  Penicillins and Prednisone   Social History: The patient  reports that she has quit smoking. Her smoking use included cigarettes. She has never used smokeless tobacco. She reports current alcohol use. She reports that she does not use drugs.   Family History: The patient's family history includes Breast cancer in her paternal grandmother; Hypertension in her brother, father, mother, and sister; Stroke (age of onset: 51) in her mother.   ROS:  Please see the history of present illness. Otherwise, complete review of systems is positive for none.  All other systems are reviewed and negative.   Physical Exam: VS:  There were no vitals taken for this visit., BMI There is no height or weight on file to calculate BMI.  Wt Readings from Last 3 Encounters:  04/09/22 183 lb 3.2 oz (83.1 kg)  02/07/22 180 lb 3.2 oz (81.7 kg)  07/10/21 189 lb 12.8 oz (86.1 kg)    General: Patient appears comfortable at rest. HEENT: Conjunctiva and lids normal, oropharynx clear with moist mucosa. Neck: Supple, no elevated JVP or carotid bruits, no thyromegaly. Lungs:  Clear to auscultation, nonlabored breathing at rest. Cardiac: Regular rate and rhythm, no S3 or significant systolic murmur, no pericardial rub. Abdomen: Soft, nontender, no hepatomegaly, bowel sounds present, no guarding or rebound. Extremities: No pitting edema, distal pulses 2+. Skin: Warm and dry. Musculoskeletal: No kyphosis. Neuropsychiatric: Alert and oriented x3, affect grossly appropriate.  ECG: Normal  sinus rhythm and frequent PVCs  Recent Labwork: 04/09/2022: ALT 10; AST 13; BUN 7; Creatinine, Ser 0.62; Hemoglobin 12.9; Platelets 246; Potassium 4.0; Sodium 143; TSH 4.910     Component Value Date/Time   CHOL 126 04/09/2022 0921   TRIG 80 04/09/2022 0921   HDL 42 04/09/2022 0921   CHOLHDL 3.0 04/09/2022 0921   CHOLHDL 2.9 04/26/2015 1002   VLDL 23 04/26/2015 1002   LDLCALC 68 04/09/2022 0921    Other Studies Reviewed Today:   Assessment and Plan: Patient is a 52 year old F with no PMH was referred to cardiology clinic for evaluation of PVCs.  # Palpitations # Frequent PVCs -Patient had palpitations and dizziness when she was off levothyroxine and continue to have occasional palpitations after being started on levothyroxine 25 mcg in 01/2022. EKG from 01/2022 showed normal sinus rhythm with frequent PVCs. Obtain 1 week event monitor to quantify the burden of PVCs and rule out ventricular arrhythmias. If more than 5% PVC burden, will obtain 2D echocardiogram to rule out cardiomyopathy.  I have spent a total of 30 minutes with patient reviewing chart, EKGs, labs and examining patient as well as establishing an assessment and plan that was discussed with the patient.  > 50% of time was spent in direct patient care.     Medication Adjustments/Labs and Tests Ordered: Current medicines are reviewed at length with the patient today.  Concerns regarding medicines are outlined above.   Tests Ordered: No orders of the defined types were placed in this encounter.   Medication Changes: No orders of the defined types were placed in this encounter.   Disposition:  Follow up  pending results  Signed, Tiara Maultsby Fidel Levy, MD, 04/11/2022 8:36 AM    [facility] [address] [place], [place] [address]

## 2022-04-14 ENCOUNTER — Other Ambulatory Visit (HOSPITAL_COMMUNITY)
Admission: RE | Admit: 2022-04-14 | Discharge: 2022-04-14 | Disposition: A | Discharge status: Home / Self Care | Payer: BC Managed Care – PPO | Source: Ambulatory Visit | Attending: Gastroenterology | Admitting: Gastroenterology

## 2022-04-14 DIAGNOSIS — K5730 Diverticulosis of large intestine without perforation or abscess without bleeding: Secondary | ICD-10-CM | POA: Diagnosis not present

## 2022-04-14 DIAGNOSIS — Z1211 Encounter for screening for malignant neoplasm of colon: Secondary | ICD-10-CM

## 2022-04-14 DIAGNOSIS — K635 Polyp of colon: Secondary | ICD-10-CM | POA: Diagnosis not present

## 2022-04-14 DIAGNOSIS — F419 Anxiety disorder, unspecified: Secondary | ICD-10-CM | POA: Diagnosis not present

## 2022-04-14 DIAGNOSIS — E039 Hypothyroidism, unspecified: Secondary | ICD-10-CM | POA: Diagnosis not present

## 2022-04-14 DIAGNOSIS — D175 Benign lipomatous neoplasm of intra-abdominal organs: Secondary | ICD-10-CM | POA: Diagnosis not present

## 2022-04-14 DIAGNOSIS — Z87891 Personal history of nicotine dependence: Secondary | ICD-10-CM | POA: Diagnosis not present

## 2022-04-14 LAB — PREGNANCY, URINE: Preg Test, Ur: NEGATIVE

## 2022-04-16 ENCOUNTER — Ambulatory Visit (HOSPITAL_COMMUNITY): Payer: BC Managed Care – PPO | Admitting: Anesthesiology

## 2022-04-16 ENCOUNTER — Ambulatory Visit (HOSPITAL_COMMUNITY)
Admission: RE | Admit: 2022-04-16 | Discharge: 2022-04-16 | Disposition: A | Discharge status: Home / Self Care | Payer: BC Managed Care – PPO | Attending: Gastroenterology | Admitting: Gastroenterology

## 2022-04-16 ENCOUNTER — Encounter (INDEPENDENT_AMBULATORY_CARE_PROVIDER_SITE_OTHER): Payer: Self-pay | Admitting: *Deleted

## 2022-04-16 ENCOUNTER — Encounter (HOSPITAL_COMMUNITY)
Admission: RE | Disposition: A | Discharge status: Home / Self Care | Payer: Self-pay | Source: Home / Self Care | Attending: Gastroenterology

## 2022-04-16 ENCOUNTER — Other Ambulatory Visit: Payer: Self-pay

## 2022-04-16 ENCOUNTER — Encounter (HOSPITAL_COMMUNITY): Payer: Self-pay | Admitting: Gastroenterology

## 2022-04-16 DIAGNOSIS — E039 Hypothyroidism, unspecified: Secondary | ICD-10-CM | POA: Insufficient documentation

## 2022-04-16 DIAGNOSIS — D175 Benign lipomatous neoplasm of intra-abdominal organs: Secondary | ICD-10-CM | POA: Diagnosis not present

## 2022-04-16 DIAGNOSIS — K5730 Diverticulosis of large intestine without perforation or abscess without bleeding: Secondary | ICD-10-CM | POA: Diagnosis not present

## 2022-04-16 DIAGNOSIS — K635 Polyp of colon: Secondary | ICD-10-CM | POA: Diagnosis not present

## 2022-04-16 DIAGNOSIS — D120 Benign neoplasm of cecum: Secondary | ICD-10-CM | POA: Diagnosis not present

## 2022-04-16 DIAGNOSIS — Z87891 Personal history of nicotine dependence: Secondary | ICD-10-CM | POA: Diagnosis not present

## 2022-04-16 DIAGNOSIS — F419 Anxiety disorder, unspecified: Secondary | ICD-10-CM | POA: Insufficient documentation

## 2022-04-16 DIAGNOSIS — Z800 Family history of malignant neoplasm of digestive organs: Secondary | ICD-10-CM

## 2022-04-16 DIAGNOSIS — K6389 Other specified diseases of intestine: Secondary | ICD-10-CM | POA: Diagnosis not present

## 2022-04-16 DIAGNOSIS — I493 Ventricular premature depolarization: Secondary | ICD-10-CM | POA: Diagnosis not present

## 2022-04-16 DIAGNOSIS — Z139 Encounter for screening, unspecified: Secondary | ICD-10-CM | POA: Diagnosis not present

## 2022-04-16 DIAGNOSIS — Z1211 Encounter for screening for malignant neoplasm of colon: Secondary | ICD-10-CM | POA: Insufficient documentation

## 2022-04-16 HISTORY — PX: POLYPECTOMY: SHX149

## 2022-04-16 HISTORY — PX: COLONOSCOPY WITH PROPOFOL: SHX5780

## 2022-04-16 LAB — HM COLONOSCOPY

## 2022-04-16 SURGERY — COLONOSCOPY WITH PROPOFOL
Anesthesia: General

## 2022-04-16 MED ORDER — LACTATED RINGERS IV SOLN
INTRAVENOUS | Status: DC
  Administered 2022-04-16: 1000 mL via INTRAVENOUS

## 2022-04-16 MED ORDER — LIDOCAINE HCL (CARDIAC) PF 100 MG/5ML IV SOSY
PREFILLED_SYRINGE | INTRAVENOUS | Status: DC | PRN
  Administered 2022-04-16: 50 mg via INTRATRACHEAL

## 2022-04-16 MED ORDER — PROPOFOL 500 MG/50ML IV EMUL
INTRAVENOUS | Status: DC | PRN
  Administered 2022-04-16: 200 ug/kg/min via INTRAVENOUS

## 2022-04-16 MED ORDER — PROPOFOL 10 MG/ML IV BOLUS
INTRAVENOUS | Status: DC | PRN
  Administered 2022-04-16: 100 mg via INTRAVENOUS

## 2022-04-16 NOTE — Op Note (Signed)
[facility] Patient Name: Erin Forbes Procedure Date: 04/16/2022 10:39 AM MRN: [id number] Date of Birth: September 17, 1970 Attending MD: Maylon Peppers , , [id number] CSN: [id number] Age: 52 Admit Type: Outpatient Procedure:                Colonoscopy Indications:              Screening for colorectal malignant neoplasm Providers:                Maylon Peppers, Crystal Page, Thomas Hoff., Technician Referring MD:              Medicines:                Monitored Anesthesia Care Complications:            No immediate complications. Estimated Blood Loss:     Estimated blood loss: none. Procedure:                Pre-Anesthesia Assessment:                           - Prior to the procedure, a History and Physical                            was performed, and patient medications, allergies                            and sensitivities were reviewed. The patient's                            tolerance of previous anesthesia was reviewed.                           - The risks and benefits of the procedure and the                            sedation options and risks were discussed with the                            patient. All questions were answered and informed                            consent was obtained.                           After obtaining informed consent, the colonoscope                            was passed under direct vision. Throughout the                            procedure, the patient's blood pressure, pulse, and                            oxygen saturations were monitored continuously. The  [id number] scope was introduced through                            the anus and advanced to the the cecum, identified                            by appendiceal orifice and ileocecal valve. The                            colonoscopy was performed without difficulty. The                            patient  tolerated the procedure well. The quality                            of the bowel preparation was good. Scope In: 11:50:18 AM Scope Out: [id number] PM Scope Withdrawal Time: 0 hours 23 minutes 42 seconds  Total Procedure Duration: 0 hours 28 minutes 23 seconds  Findings:      The perianal and digital rectal examinations were normal.      A 3 mm polyp was found in the cecum. The polyp was sessile. The polyp       was removed with a cold snare. Resection and retrieval were complete.      There was a small lipoma, in the ascending colon.      A few small-mouthed diverticula were found in the sigmoid colon and       ascending colon.      The retroflexed view of the distal rectum and anal verge was normal and       showed no anal or rectal abnormalities. Impression:               - One 3 mm polyp in the cecum, removed with a cold                            snare. Resected and retrieved.                           - Small lipoma in the ascending colon.                           - Diverticulosis in the sigmoid colon and in the                            ascending colon.                           - The distal rectum and anal verge are normal on                            retroflexion view. Moderate Sedation:      Per Anesthesia Care Recommendation:           - Discharge patient to home (ambulatory).                           - Resume previous  diet.                           - Await pathology results.                           - Repeat colonoscopy for surveillance based on                            pathology results. Procedure Code(s):        --- Professional ---                           [phone], Colonoscopy, flexible; with removal of                            tumor(s), polyp(s), or other lesion(s) by snare                            technique Diagnosis Code(s):        --- Professional ---                           Z12.11, Encounter for screening for malignant                             neoplasm of colon                           D12.0, Benign neoplasm of cecum                           D17.5, Benign lipomatous neoplasm of                            intra-abdominal organs                           K57.30, Diverticulosis of large intestine without                            perforation or abscess without bleeding CPT copyright 2022 American Medical Association. All rights reserved. The codes documented in this report are preliminary and upon coder review may  be revised to meet current compliance requirements. Maylon Peppers, MD Maylon Peppers,  04/16/2022 12:29:31 PM This report has been signed electronically. Number of Addenda: 0

## 2022-04-16 NOTE — Discharge Instructions (Signed)
You are being discharged to home.  Resume your previous diet.  We are waiting for your pathology results.  Your physician has recommended a repeat colonoscopy for surveillance based on pathology results.  

## 2022-04-16 NOTE — Anesthesia Postprocedure Evaluation (Signed)
Anesthesia Post Note  Patient: Erin Forbes  Procedure(s) Performed: COLONOSCOPY WITH PROPOFOL POLYPECTOMY INTESTINAL  Patient location during evaluation: Phase II Anesthesia Type: General Level of consciousness: awake and alert and oriented Pain management: pain level controlled Vital Signs Assessment: post-procedure vital signs reviewed and stable Respiratory status: spontaneous breathing, nonlabored ventilation and respiratory function stable Cardiovascular status: blood pressure returned to baseline and stable Postop Assessment: no apparent nausea or vomiting Anesthetic complications: no  No notable events documented.   Last Vitals:  Vitals:   04/16/22 1223 04/16/22 1225  BP: (!) 83/48 99/60  Pulse: (!) 59 60  Resp: 16 18  Temp: 36.6 C   SpO2: 99% 100%    Last Pain:  Vitals:   04/16/22 1225  TempSrc:   PainSc: 0-No pain                 Nasiir Monts C Railynn Ballo

## 2022-04-16 NOTE — Anesthesia Preprocedure Evaluation (Addendum)
Anesthesia Evaluation  Patient identified by MRN, date of birth, ID band Patient awake    Reviewed: Allergy & Precautions, H&P , NPO status , Patient's Chart, lab work & pertinent test results  Airway Mallampati: II  TM Distance: >3 FB Neck ROM: Full    Dental  (+) Dental Advisory Given, Missing   Pulmonary former smoker   Pulmonary exam normal breath sounds clear to auscultation       Cardiovascular Exercise Tolerance: Good Normal cardiovascular exam+ dysrhythmias (palpitations, PVCs)  Rhythm:Regular Rate:Normal     Neuro/Psych  Headaches PSYCHIATRIC DISORDERS Anxiety        GI/Hepatic negative GI ROS, Neg liver ROS,,,  Endo/Other  Hypothyroidism    Renal/GU negative Renal ROS  negative genitourinary   Musculoskeletal negative musculoskeletal ROS (+)    Abdominal   Peds negative pediatric ROS (+)  Hematology negative hematology ROS (+)   Anesthesia Other Findings   Reproductive/Obstetrics negative OB ROS                             Anesthesia Physical Anesthesia Plan  ASA: 2  Anesthesia Plan: General   Post-op Pain Management: Minimal or no pain anticipated   Induction: Intravenous  PONV Risk Score and Plan: 1 and Propofol infusion  Airway Management Planned: Nasal Cannula and Natural Airway  Additional Equipment:   Intra-op Plan:   Post-operative Plan:   Informed Consent: I have reviewed the patients History and Physical, chart, labs and discussed the procedure including the risks, benefits and alternatives for the proposed anesthesia with the patient or authorized representative who has indicated his/her understanding and acceptance.     Dental advisory given  Plan Discussed with: CRNA and Surgeon  Anesthesia Plan Comments:        Anesthesia Quick Evaluation

## 2022-04-16 NOTE — H&P (Signed)
Erin Forbes is an 52 y.o. female.   Chief Complaint: CRC screening HPI: 52 y/o F with no past medical history, coming for screening colonoscopy. The patient has never had a colonoscopy in the past.  The patient denies having any complaints such as melena, hematochezia, abdominal pain or distention, change in her bowel movement consistency or frequency, no changes in weight recently.  No family history of colorectal cancer.   Past Medical History:  Diagnosis Date   Headache 2000    Past Surgical History:  Procedure Laterality Date   BREAST REDUCTION SURGERY Bilateral 10/30/2014   Procedure: BREAST REDUCTION WITH LIPOSUCTION;  Surgeon: [name], MD;  Location: [facility];  Service: Plastics;  Laterality: Bilateral;   BREAST SURGERY N/A    Phreesia 10/18/2019   CESAREAN SECTION  1995, 2002   two   CESAREAN SECTION N/A    Phreesia 10/18/2019   CHOLECYSTECTOMY     MASS EXCISION Bilateral 10/30/2014   Procedure: BILATERAL EXCISION ACCESSORY BREAST TISSUE;  Surgeon: [name], MD;  Location: [facility];  Service: Plastics;  Laterality: Bilateral;   REDUCTION MAMMAPLASTY Bilateral     Family History  Problem Relation Age of Onset   Stroke Mother 74   Hypertension Mother    Hypertension Father    Hypertension Sister    Hypertension Brother    Breast cancer Paternal Grandmother    Social History:  reports that she has quit smoking. Her smoking use included cigarettes. She has never used smokeless tobacco. She reports that she does not currently use alcohol. She reports that she does not use drugs.  Allergies:  Allergies  Allergen Reactions   Penicillins Rash   Prednisone Rash    Medications Prior to Admission  Medication Sig Dispense Refill   levothyroxine (SYNTHROID) 25 MCG tablet Take 1 tablet (25 mcg total) by mouth daily. 90 tablet 1   hydrOXYzine (ATARAX) 10 MG tablet Take 1 tablet (10 mg total) by mouth 2 (two) times daily as  needed for anxiety. 30 tablet 1   meclizine (ANTIVERT) 25 MG tablet Take 1 tablet (25 mg total) by mouth 2 (two) times daily as needed for dizziness. 30 tablet 0   Vitamin D, Ergocalciferol, (DRISDOL) 1.25 MG (50000 UNIT) CAPS capsule Take 1 capsule (50,000 Units total) by mouth every 7 (seven) days. 12 capsule 1    No results found for this or any previous visit (from the past 48 hour(s)). No results found.  Review of Systems  All other systems reviewed and are negative.   Blood pressure 120/87, pulse 69, temperature 98.4 F (36.9 C), temperature source Oral, resp. rate 10, height 5\' 6"  (1.676 m), weight 83.9 kg, last menstrual period 11/06/2021, SpO2 100 %. Physical Exam  GENERAL: The patient is AO x3, in no acute distress. HEENT: Head is normocephalic and atraumatic. EOMI are intact. Mouth is well hydrated and without lesions. NECK: Supple. No masses LUNGS: Clear to auscultation. No presence of rhonchi/wheezing/rales. Adequate chest expansion HEART: RRR, normal s1 and s2. ABDOMEN: Soft, nontender, no guarding, no peritoneal signs, and nondistended. BS +. No masses. EXTREMITIES: Without any cyanosis, clubbing, rash, lesions or edema. NEUROLOGIC: AOx3, no focal motor deficit. SKIN: no jaundice, no rashes  Assessment/Plan 52 y/o F with no past medical history, coming for screening colonoscopy. The patient is at average risk for colorectal cancer.  We will proceed with colonoscopy today.   Harvel Quale, MD 04/16/2022, 10:55 AM

## 2022-04-16 NOTE — Transfer of Care (Signed)
Immediate Anesthesia Transfer of Care Note  Patient: Erin Forbes  Procedure(s) Performed: COLONOSCOPY WITH PROPOFOL POLYPECTOMY INTESTINAL  Patient Location: Endoscopy Unit  Anesthesia Type:General  Level of Consciousness: awake, alert , oriented, and patient cooperative  Airway & Oxygen Therapy: Patient Spontanous Breathing  Post-op Assessment: Report given to RN, Post -op Vital signs reviewed and stable, and Patient moving all extremities  Post vital signs: Reviewed and stable  Last Vitals:  Vitals Value Taken Time  BP 83/48 04/16/22 1223  Temp 36.6 C 04/16/22 1223  Pulse 59 04/16/22 1223  Resp 16 04/16/22 1223  SpO2 99 % 04/16/22 1223    Last Pain:  Vitals:   04/16/22 1223  TempSrc: Axillary  PainSc: 0-No pain      Patients Stated Pain Goal: 10 ([phone])  Complications: No notable events documented.

## 2022-04-20 LAB — SURGICAL PATHOLOGY

## 2022-04-22 ENCOUNTER — Other Ambulatory Visit: Payer: Self-pay

## 2022-04-25 ENCOUNTER — Encounter (HOSPITAL_COMMUNITY): Payer: Self-pay | Admitting: Gastroenterology

## 2022-05-12 ENCOUNTER — Other Ambulatory Visit: Payer: Self-pay

## 2022-08-13 ENCOUNTER — Ambulatory Visit: Payer: BC Managed Care – PPO | Admitting: Internal Medicine

## 2022-08-14 ENCOUNTER — Encounter: Payer: Self-pay | Admitting: Internal Medicine

## 2022-10-24 ENCOUNTER — Other Ambulatory Visit: Payer: Self-pay

## 2022-10-24 ENCOUNTER — Telehealth: Payer: Self-pay | Admitting: Internal Medicine

## 2022-10-24 DIAGNOSIS — E039 Hypothyroidism, unspecified: Secondary | ICD-10-CM

## 2022-10-24 MED ORDER — LEVOTHYROXINE SODIUM 25 MCG PO TABS: 25.0000 ug | ORAL_TABLET | Freq: Every day | ORAL | 1 refills | Status: DC

## 2022-10-24 NOTE — Telephone Encounter (Signed)
Refill sent.

## 2022-10-24 NOTE — Telephone Encounter (Signed)
Prescription Request  10/24/2022  LOV: 04/09/2022  What is the name of the medication or equipment? levothyroxine (SYNTHROID) 25 MCG tablet [[id number]]    Have you contacted your pharmacy to request a refill? No   Which pharmacy would you like this sent to?  [facility] [address], [place] - [address]  [place] [address] Phone: [phone] Fax: [phone]    Patient notified that their request is being sent to the clinical staff for review and that they should receive a response within 2 business days.   Please advise at [facility] [phone]

## 2022-11-05 ENCOUNTER — Ambulatory Visit: Payer: BC Managed Care – PPO | Admitting: Internal Medicine

## 2022-11-20 ENCOUNTER — Ambulatory Visit: Payer: BC Managed Care – PPO | Admitting: Internal Medicine

## 2022-11-20 ENCOUNTER — Encounter: Payer: Self-pay | Admitting: Internal Medicine

## 2022-11-20 VITALS — BP 122/81 | HR 62 | Ht 66.0 in | Wt 187.4 lb

## 2022-11-20 DIAGNOSIS — Z1231 Encounter for screening mammogram for malignant neoplasm of breast: Secondary | ICD-10-CM

## 2022-11-20 DIAGNOSIS — E66811 Obesity, class 1: Secondary | ICD-10-CM

## 2022-11-20 DIAGNOSIS — J302 Other seasonal allergic rhinitis: Secondary | ICD-10-CM

## 2022-11-20 DIAGNOSIS — R7303 Prediabetes: Secondary | ICD-10-CM

## 2022-11-20 DIAGNOSIS — E039 Hypothyroidism, unspecified: Secondary | ICD-10-CM | POA: Diagnosis not present

## 2022-11-20 MED ORDER — FLUTICASONE PROPIONATE 50 MCG/ACT NA SUSP: 2.0000 | Freq: Every day | NASAL | 2 refills | Status: AC

## 2022-11-20 NOTE — Progress Notes (Signed)
Established Patient Office Visit  Subjective:  Patient ID: Erin Forbes, female    DOB: Dec 29, 1970  Age: 52 y.o. MRN: [id number]  CC:  Chief Complaint  Patient presents with   Hypothyroidism    Follow up     HPI Erin Forbes is a 52 y.o. female with past medical history of hypothyroidism, constipation, seasonal allergies and breast surgery for macromastia who presents for f/u of her chronic medical conditions.  Hypothyroidism: She takes Levothyroxine regularly. Denies any throat pain, palpitations, tremors or recent skin or nail changes. She has been trying to lose weight with diet modification, but has been unsuccessful.  She reports nasal congestion, postnasal drip and throat irritation for the last 1 week.  She also has bilateral eye watering and itching.  Denies any fever or chills.  Denies any dyspnea or wheezing.   Past Medical History:  Diagnosis Date   Headache 2000    Past Surgical History:  Procedure Laterality Date   BREAST REDUCTION SURGERY Bilateral 10/30/2014   Procedure: BREAST REDUCTION WITH LIPOSUCTION;  Surgeon: Louisa Second, MD;  Location: [facility];  Service: Plastics;  Laterality: Bilateral;   BREAST SURGERY N/A    Phreesia 10/18/2019   CESAREAN SECTION  1995, 2002   two   CESAREAN SECTION N/A    Phreesia 10/18/2019   CHOLECYSTECTOMY     COLONOSCOPY WITH PROPOFOL N/A 04/16/2022   Procedure: COLONOSCOPY WITH PROPOFOL;  Surgeon: Dolores Frame, MD;  [facility];  Service: Gastroenterology;  Laterality: N/A;  11:45AM;ASA1-2   MASS EXCISION Bilateral 10/30/2014   Procedure: BILATERAL EXCISION ACCESSORY BREAST TISSUE;  Surgeon: Louisa Second, MD;  Location: [facility];  Service: Plastics;  Laterality: Bilateral;   POLYPECTOMY  04/16/2022   Procedure: POLYPECTOMY INTESTINAL;  Surgeon: Marguerita Merles, Reuel Boom, MD;  Location: [facility];  Service: Gastroenterology;;   REDUCTION MAMMAPLASTY  Bilateral     Family History  Problem Relation Age of Onset   Stroke Mother 69   Hypertension Mother    Hypertension Father    Hypertension Sister    Hypertension Brother    Breast cancer Paternal Grandmother     Social History   Socioeconomic History   Marital status: Single    Spouse name: Not on file   Number of children: Not on file   Years of education: Not on file   Highest education level: Not on file  Occupational History    Employer: [employer]  Tobacco Use   Smoking status: Former    Types: Cigarettes   Smokeless tobacco: Never   Tobacco comments:    Quit 19 years ago, occasionally used to smoke 1 cigarette, smoked for about 2 years  Vaping Use   Vaping status: Never Used  Substance and Sexual Activity   Alcohol use: Not Currently    Comment: occ   Drug use: No   Sexual activity: Not Currently    Birth control/protection: None  Other Topics Concern   Not on file  Social History Narrative   Not on file   Social Determinants of Health   Financial Resource Strain: Medium Risk (12/06/2019)   Overall Financial Resource Strain (CARDIA)    Difficulty of Paying Living Expenses: Somewhat hard  Food Insecurity: Food Insecurity Present (12/06/2019)   Hunger Vital Sign    Worried About Running Out of Food in the Last Year: Sometimes true    Ran Out of Food in the Last Year: Never true  Transportation  Needs: No Transportation Needs (12/06/2019)   PRAPARE - Administrator, Civil Service (Medical): No    Lack of Transportation (Non-Medical): No  Physical Activity: Insufficiently Active (12/06/2019)   Exercise Vital Sign    Days of Exercise per Week: 2 days    Minutes of Exercise per Session: 20 min  Stress: No Stress Concern Present (12/06/2019)   [employer] of Occupational Health - Occupational Stress Questionnaire    Feeling of Stress : Only a little  Social Connections: Unknown (12/06/2019)   Social Connection and Isolation Panel  [NHANES]    Frequency of Communication with Friends and Family: Three times a week    Frequency of Social Gatherings with Friends and Family: More than three times a week    Attends Religious Services: Patient declined    Active Member of Clubs or Organizations: Yes    Attends Banker Meetings: 1 to 4 times per year    Marital Status: Never married  Intimate Partner Violence: Not At Risk (12/06/2019)   Humiliation, Afraid, Rape, and Kick questionnaire    Fear of Current or Ex-Partner: No    Emotionally Abused: No    Physically Abused: No    Sexually Abused: No    Outpatient Medications Prior to Visit  Medication Sig Dispense Refill   hydrOXYzine (ATARAX) 10 MG tablet Take 1 tablet (10 mg total) by mouth 2 (two) times daily as needed for anxiety. 30 tablet 1   levothyroxine (SYNTHROID) 25 MCG tablet Take 1 tablet (25 mcg total) by mouth daily. 90 tablet 1   meclizine (ANTIVERT) 25 MG tablet Take 1 tablet (25 mg total) by mouth 2 (two) times daily as needed for dizziness. 30 tablet 0   Vitamin D, Ergocalciferol, (DRISDOL) 1.25 MG (50000 UNIT) CAPS capsule Take 1 capsule (50,000 Units total) by mouth every 7 (seven) days. 12 capsule 1   No facility-administered medications prior to visit.    Allergies  Allergen Reactions   Penicillins Rash   Prednisone Rash    ROS Review of Systems  Constitutional:  Negative for chills and fever.  HENT:  Positive for congestion and postnasal drip.   Eyes:  Negative for pain and discharge.  Respiratory:  Negative for cough and shortness of breath.   Cardiovascular:  Negative for chest pain and palpitations.  Gastrointestinal:  Positive for constipation. Negative for abdominal pain, nausea and vomiting.  Endocrine: Negative for polydipsia and polyuria.  Genitourinary:  Negative for dysuria and hematuria.  Musculoskeletal:  Negative for neck pain and neck stiffness.  Skin:  Negative for rash.  Neurological:  Negative for dizziness  and weakness.  Psychiatric/Behavioral:  Negative for agitation and behavioral problems.       Objective:    Physical Exam Vitals reviewed.  Constitutional:      General: She is not in acute distress.    Appearance: She is obese. She is not diaphoretic.  HENT:     Head: Normocephalic and atraumatic.     Nose: Congestion present.     Mouth/Throat:     Mouth: Mucous membranes are moist.     Pharynx: Posterior oropharyngeal erythema present.  Eyes:     General: No scleral icterus.    Extraocular Movements: Extraocular movements intact.  Cardiovascular:     Rate and Rhythm: Normal rate and regular rhythm.     Pulses: Normal pulses.     Heart sounds: Normal heart sounds. No murmur heard. Pulmonary:     Breath sounds: Normal breath  sounds. No wheezing or rales.  Musculoskeletal:     Cervical back: Neck supple. No tenderness.     Right lower leg: No edema.     Left lower leg: No edema.  Skin:    General: Skin is warm.     Findings: No rash.  Neurological:     General: No focal deficit present.     Mental Status: She is alert and oriented to person, place, and time.     Sensory: No sensory deficit.     Motor: No weakness.  Psychiatric:        Mood and Affect: Mood normal.        Behavior: Behavior normal.     BP 122/81 (BP Location: Right Arm, Patient Position: Sitting, Cuff Size: Normal)   Pulse 62   Ht 5\' 6"  (1.676 m)   Wt 187 lb 6.4 oz (85 kg)   SpO2 99%   BMI 30.25 kg/m  Wt Readings from Last 3 Encounters:  11/20/22 187 lb 6.4 oz (85 kg)  04/16/22 185 lb (83.9 kg)  04/11/22 185 lb (83.9 kg)    Lab Results  Component Value Date   TSH 4.910 (H) 04/09/2022   Lab Results  Component Value Date   WBC 7.6 04/09/2022   HGB 12.9 04/09/2022   HCT 39.2 04/09/2022   MCV 84 04/09/2022   PLT 246 04/09/2022   Lab Results  Component Value Date   NA 143 04/09/2022   K 4.0 04/09/2022   CO2 21 04/09/2022   GLUCOSE 93 04/09/2022   BUN 7 04/09/2022   CREATININE 0.62  04/09/2022   BILITOT 0.6 04/09/2022   ALKPHOS 107 04/09/2022   AST 13 04/09/2022   ALT 10 04/09/2022   PROT 6.9 04/09/2022   ALBUMIN 4.5 04/09/2022   CALCIUM 9.2 04/09/2022   ANIONGAP 5 08/28/2018   EGFR 108 04/09/2022   Lab Results  Component Value Date   CHOL 126 04/09/2022   Lab Results  Component Value Date   HDL 42 04/09/2022   Lab Results  Component Value Date   LDLCALC 68 04/09/2022   Lab Results  Component Value Date   TRIG 80 04/09/2022   Lab Results  Component Value Date   CHOLHDL 3.0 04/09/2022   Lab Results  Component Value Date   HGBA1C 6.0 (H) 04/09/2022      Assessment & Plan:   Problem List Items Addressed This Visit       Endocrine   Hypothyroidism - Primary    Lab Results  Component Value Date   TSH 4.910 (H) 04/09/2022   On Levothyroxine 25 mcg QD Check TSH and free T4 and will decide dose accordingly      Relevant Orders   TSH + free T4     Other   Obesity (BMI 30.0-34.9)    BMI Readings from Last 3 Encounters:  11/20/22 30.25 kg/m  04/16/22 29.86 kg/m  04/11/22 30.32 kg/m   Diet modification advised Moderate exercise/walking for 30 mins/day for 5 days in a week advised Will encourage in the subsequent visits Had discussion about GLP-1 agonist therapy, but would prefer to see thyroid profile improving before considering medical weight loss options - she also wants to wait for now      Prediabetes    Lab Results  Component Value Date   HGBA1C 6.0 (H) 04/09/2022   Advised to follow low carbohydrate diet      Relevant Orders   Hemoglobin A1c   Seasonal allergies  Her symptoms are likely due to allergies Flonase for nasal congestion and allergies Can use Pataday eyedrops for eye itching Advised to take Zyrtec for allergies      Relevant Medications   fluticasone (FLONASE) 50 MCG/ACT nasal spray   Other Visit Diagnoses     Encounter for screening mammogram for malignant neoplasm of breast           Meds  ordered this encounter  Medications   fluticasone (FLONASE) 50 MCG/ACT nasal spray    Sig: Place 2 sprays into both nostrils daily.    Dispense:  16 g    Refill:  2    Follow-up: Return in about 5 months (around 04/20/2023).    Anabel Halon, MD

## 2022-11-20 NOTE — Assessment & Plan Note (Signed)
Lab Results  Component Value Date   HGBA1C 6.0 (H) 04/09/2022   Advised to follow low carbohydrate diet

## 2022-11-20 NOTE — Assessment & Plan Note (Signed)
Her symptoms are likely due to allergies Flonase for nasal congestion and allergies Can use Pataday eyedrops for eye itching Advised to take Zyrtec for allergies

## 2022-11-20 NOTE — Assessment & Plan Note (Signed)
BMI Readings from Last 3 Encounters:  11/20/22 30.25 kg/m  04/16/22 29.86 kg/m  04/11/22 30.32 kg/m   Diet modification advised Moderate exercise/walking for 30 mins/day for 5 days in a week advised Will encourage in the subsequent visits Had discussion about GLP-1 agonist therapy, but would prefer to see thyroid profile improving before considering medical weight loss options - she also wants to wait for now

## 2022-11-20 NOTE — Patient Instructions (Signed)
Please use Flonase for allergies. Okay to take Zyrtec for allergies.  Please continue to take medications as prescribed.  Please continue to follow low carb diet and perform moderate exercise/walking at least 150 mins/week.

## 2022-11-20 NOTE — Assessment & Plan Note (Signed)
Lab Results  Component Value Date   TSH 4.910 (H) 04/09/2022   On Levothyroxine 25 mcg QD Check TSH and free T4 and will decide dose accordingly

## 2022-11-22 LAB — HEMOGLOBIN A1C
Est. average glucose Bld gHb Est-mCnc: 120 mg/dL
Hgb A1c MFr Bld: 5.8 % — ABNORMAL HIGH (ref 4.8–5.6)

## 2022-11-22 LAB — TSH+FREE T4
Free T4: 1.46 ng/dL (ref 0.82–1.77)
TSH: 8.04 u[IU]/mL — ABNORMAL HIGH (ref 0.450–4.500)

## 2022-11-24 ENCOUNTER — Other Ambulatory Visit: Payer: Self-pay | Admitting: Internal Medicine

## 2022-11-24 DIAGNOSIS — E039 Hypothyroidism, unspecified: Secondary | ICD-10-CM

## 2022-11-24 DIAGNOSIS — E559 Vitamin D deficiency, unspecified: Secondary | ICD-10-CM

## 2022-11-24 DIAGNOSIS — F411 Generalized anxiety disorder: Secondary | ICD-10-CM

## 2022-11-24 DIAGNOSIS — R7303 Prediabetes: Secondary | ICD-10-CM

## 2022-11-24 DIAGNOSIS — E782 Mixed hyperlipidemia: Secondary | ICD-10-CM

## 2022-11-24 MED ORDER — LEVOTHYROXINE SODIUM 50 MCG PO TABS: 50.0000 ug | ORAL_TABLET | Freq: Every day | ORAL | 1 refills | Status: DC

## 2023-06-01 ENCOUNTER — Encounter: Payer: Self-pay | Admitting: Internal Medicine

## 2023-06-01 ENCOUNTER — Other Ambulatory Visit (HOSPITAL_COMMUNITY): Payer: Self-pay

## 2023-06-01 ENCOUNTER — Telehealth: Payer: Self-pay | Admitting: Pharmacy Technician

## 2023-06-01 ENCOUNTER — Ambulatory Visit: Payer: Self-pay | Admitting: Internal Medicine

## 2023-06-01 VITALS — BP 128/86 | HR 79 | Ht 66.0 in | Wt 187.0 lb

## 2023-06-01 DIAGNOSIS — Z1231 Encounter for screening mammogram for malignant neoplasm of breast: Secondary | ICD-10-CM | POA: Diagnosis not present

## 2023-06-01 DIAGNOSIS — R7303 Prediabetes: Secondary | ICD-10-CM | POA: Diagnosis not present

## 2023-06-01 DIAGNOSIS — E66811 Obesity, class 1: Secondary | ICD-10-CM | POA: Diagnosis not present

## 2023-06-01 DIAGNOSIS — F411 Generalized anxiety disorder: Secondary | ICD-10-CM

## 2023-06-01 DIAGNOSIS — E039 Hypothyroidism, unspecified: Secondary | ICD-10-CM | POA: Diagnosis not present

## 2023-06-01 MED ORDER — SEMAGLUTIDE-WEIGHT MANAGEMENT 1 MG/0.5ML ~~LOC~~ SOAJ: 1.0000 mg | SUBCUTANEOUS | 0 refills | Status: DC

## 2023-06-01 MED ORDER — SEMAGLUTIDE-WEIGHT MANAGEMENT 0.25 MG/0.5ML ~~LOC~~ SOAJ: 0.2500 mg | SUBCUTANEOUS | 0 refills | Status: AC

## 2023-06-01 MED ORDER — SEMAGLUTIDE-WEIGHT MANAGEMENT 0.5 MG/0.5ML ~~LOC~~ SOAJ: 0.5000 mg | SUBCUTANEOUS | 0 refills | Status: DC

## 2023-06-01 NOTE — Assessment & Plan Note (Addendum)
 BMI Readings from Last 3 Encounters:  06/01/23 30.18 kg/m  11/20/22 30.25 kg/m  04/16/22 29.86 kg/m   Advised to follow low-carb diet, has tried for the last 1 year Moderate exercise/walking for 30 mins/day for 5 days in a week advised Will encourage in the subsequent visits Discussed medical weight loss options, including benefits and side effects - she agrees to start Wegovy Started with Wegovy-initial BMI: 30.18, plan to increase dose as tolerated

## 2023-06-01 NOTE — Telephone Encounter (Signed)
 Pharmacy Patient Advocate Encounter  Received notification from [facility] that Prior Authorization for Wegovy 0.25MG /0.5ML auto-injectors has been APPROVED from 06/01/2023 to 12/28/2023. Ran test claim, Copay is $0.00. This test claim was processed through [facility]- copay amounts may vary at other pharmacies due to pharmacy/plan contracts, or as the patient moves through the different stages of their insurance plan.   PA #/Case ID/Reference #: Key: [id number]

## 2023-06-01 NOTE — Assessment & Plan Note (Signed)
 Overall well-controlled Hydroxyzine  as needed for anxiety

## 2023-06-01 NOTE — Telephone Encounter (Signed)
 Pharmacy Patient Advocate Encounter   Received notification from CoverMyMeds that prior authorization for [facility] 0.25MG /0.5ML auto-injectors is required/requested.   Insurance verification completed.   The patient is insured through [facility] .   Per test claim: PA required; PA submitted to above mentioned insurance via CoverMyMeds Key/confirmation #/EOC [id number] Status is pending

## 2023-06-01 NOTE — Progress Notes (Signed)
 Established Patient Office Visit  Subjective:  Patient ID: Erin Forbes, female    DOB: [birth year]  Age: 53 y.o. MRN: [id number]  CC:  Chief Complaint  Patient presents with   Hypothyroidism    Follow up    Weight Loss    Discuss options    HPI Erin Forbes is a 53 y.o. female with past medical history of hypothyroidism, constipation, seasonal allergies and breast surgery for macromastia who presents for f/u of her chronic medical conditions.  Hypothyroidism: She takes Levothyroxine  regularly. Denies any throat pain, palpitations, tremors or recent skin or nail changes.   She has been trying to lose weight with diet modification for more than a year now, but has been unsuccessful.  She has joined structured diet and exercise program for the last 6 months as well.    Past Medical History:  Diagnosis Date   Headache 2000    Past Surgical History:  Procedure Laterality Date   BREAST REDUCTION SURGERY Bilateral 10/30/2014   Procedure: BREAST REDUCTION WITH LIPOSUCTION;  Surgeon: Phyllis Breeze, MD;  Location: [facility];  Service: Plastics;  Laterality: Bilateral;   BREAST SURGERY N/A    [facility]reesia 10/18/2019   CESAREAN SECTION  1995, 2002   two   CESAREAN SECTION N/A    Phreesia 10/18/2019   CHOLECYSTECTOMY     COLONOSCOPY WITH PROPOFOL  N/A 04/16/2022   Procedure: COLONOSCOPY WITH PROPOFOL ;  Surgeon: Urban Garden, MD;  [facility]: [facility];  Service: Gastroenterology;  Laterality: N/A;  11:45AM;ASA1-2   MASS EXCISION Bilateral 10/30/2014   Procedure: BILATERAL EXCISION ACCESSORY BREAST TISSUE;  Surgeon: Phyllis Breeze, MD;  Location: [facility];  Service: Plastics;  Laterality: Bilateral;   POLYPECTOMY  04/16/2022   Procedure: POLYPECTOMY INTESTINAL;  Surgeon: Umberto Ganong, Bearl Limes, MD;  [facility];  Service: Gastroenterology;;   REDUCTION MAMMAPLASTY Bilateral     Family History  Problem Relation Age  of Onset   Stroke Mother 60   Hypertension Mother    Hypertension Father    Hypertension Sister    Hypertension Brother    Breast cancer Paternal Grandmother     Social History   Socioeconomic History   Marital status: Single    Spouse name: Not on file   Number of children: Not on file   Years of education: Not on file   Highest education level: Not on file  Occupational History    Employer: [employer]  Tobacco Use   Smoking status: Former    Types: Cigarettes   Smokeless tobacco: Never   Tobacco comments:    Quit 19 years ago, occasionally used to smoke 1 cigarette, smoked for about 2 years  Vaping Use   Vaping status: Never Used  Substance and Sexual Activity   Alcohol use: Not Currently    Comment: occ   Drug use: No   Sexual activity: Not Currently    Birth control/protection: None  Other Topics Concern   Not on file  Social History Narrative   Not on file   Social Drivers of Health   Financial Resource Strain: Medium Risk (12/06/2019)   Overall Financial Resource Strain (CARDIA)    Difficulty of Paying Living Expenses: Somewhat hard  Food Insecurity: Food Insecurity Present (12/06/2019)   Hunger Vital Sign    Worried About Running Out of Food in the Last Year: Sometimes true    Ran Out of Food in the Last Year: Never true  Transportation Needs: No  Transportation Needs (12/06/2019)   PRAPARE - Administrator, Civil Service (Medical): No    Lack of Transportation (Non-Medical): No  Physical Activity: Insufficiently Active (12/06/2019)   Exercise Vital Sign    Days of Exercise per Week: 2 days    Minutes of Exercise per Session: 20 min  Stress: No Stress Concern Present (12/06/2019)   [employer] of Occupational Health - Occupational Stress Questionnaire    Feeling of Stress : Only a little  Social Connections: Unknown (12/06/2019)   Social Connection and Isolation Panel [NHANES]    Frequency of Communication with Friends and  Family: Three times a week    Frequency of Social Gatherings with Friends and Family: More than three times a week    Attends Religious Services: Patient declined    Active Member of Clubs or Organizations: Yes    Attends Banker Meetings: 1 to 4 times per year    Marital Status: Never married  Intimate Partner Violence: Not At Risk (12/06/2019)   Humiliation, Afraid, Rape, and Kick questionnaire    Fear of Current or Ex-Partner: No    Emotionally Abused: No    Physically Abused: No    Sexually Abused: No    Outpatient Medications Prior to Visit  Medication Sig Dispense Refill   fluticasone  (FLONASE ) 50 MCG/ACT nasal spray Place 2 sprays into both nostrils daily. 16 g 2   hydrOXYzine  (ATARAX ) 10 MG tablet Take 1 tablet (10 mg total) by mouth 2 (two) times daily as needed for anxiety. 30 tablet 1   levothyroxine  (SYNTHROID ) 50 MCG tablet Take 1 tablet (50 mcg total) by mouth daily. 90 tablet 1   meclizine  (ANTIVERT ) 25 MG tablet Take 1 tablet (25 mg total) by mouth 2 (two) times daily as needed for dizziness. 30 tablet 0   Vitamin D , Ergocalciferol , (DRISDOL ) 1.25 MG (50000 UNIT) CAPS capsule Take 1 capsule (50,000 Units total) by mouth every 7 (seven) days. 12 capsule 1   No facility-administered medications prior to visit.    Allergies  Allergen Reactions   Penicillins Rash   Prednisone  Rash    ROS Review of Systems  Constitutional:  Negative for chills and fever.  HENT:  Negative for congestion and postnasal drip.   Eyes:  Negative for pain and discharge.  Respiratory:  Negative for cough and shortness of breath.   Cardiovascular:  Negative for chest pain and palpitations.  Gastrointestinal:  Positive for constipation. Negative for abdominal pain, nausea and vomiting.  Endocrine: Negative for polydipsia and polyuria.  Genitourinary:  Negative for dysuria and hematuria.  Musculoskeletal:  Negative for neck pain and neck stiffness.  Skin:  Negative for rash.   Neurological:  Negative for dizziness and weakness.  Psychiatric/Behavioral:  Negative for agitation and behavioral problems.       Objective:    Physical Exam Vitals reviewed.  Constitutional:      General: She is not in acute distress.    Appearance: She is obese. She is not diaphoretic.  HENT:     Head: Normocephalic and atraumatic.     Nose: No congestion.     Mouth/Throat:     Mouth: Mucous membranes are moist.     Pharynx: No posterior oropharyngeal erythema.  Eyes:     General: No scleral icterus.    Extraocular Movements: Extraocular movements intact.  Cardiovascular:     Rate and Rhythm: Normal rate and regular rhythm.     Pulses: Normal pulses.     Heart  sounds: Normal heart sounds. No murmur heard. Pulmonary:     Breath sounds: Normal breath sounds. No wheezing or rales.  Musculoskeletal:     Cervical back: Neck supple. No tenderness.     Right lower leg: No edema.     Left lower leg: No edema.  Skin:    General: Skin is warm.     Findings: No rash.  Neurological:     General: No focal deficit present.     Mental Status: She is alert and oriented to person, place, and time.     Sensory: No sensory deficit.     Motor: No weakness.  Psychiatric:        Mood and Affect: Mood normal.        Behavior: Behavior normal.     BP 128/86   Pulse 79   Ht 5\' 6"  (1.676 m)   Wt 187 lb (84.8 kg)   SpO2 98%   BMI 30.18 kg/m  Wt Readings from Last 3 Encounters:  06/01/23 187 lb (84.8 kg)  11/20/22 187 lb 6.4 oz (85 kg)  04/16/22 185 lb (83.9 kg)    Lab Results  Component Value Date   TSH 8.040 (H) 11/20/2022   Lab Results  Component Value Date   WBC 7.6 04/09/2022   HGB 12.9 04/09/2022   HCT 39.2 04/09/2022   MCV 84 04/09/2022   PLT 246 04/09/2022   Lab Results  Component Value Date   NA 143 04/09/2022   K 4.0 04/09/2022   CO2 21 04/09/2022   GLUCOSE 93 04/09/2022   BUN 7 04/09/2022   CREATININE 0.62 04/09/2022   BILITOT 0.6 04/09/2022    ALKPHOS 107 04/09/2022   AST 13 04/09/2022   ALT 10 04/09/2022   PROT 6.9 04/09/2022   ALBUMIN 4.5 04/09/2022   CALCIUM 9.2 04/09/2022   ANIONGAP 5 08/28/2018   EGFR 108 04/09/2022   Lab Results  Component Value Date   CHOL 126 04/09/2022   Lab Results  Component Value Date   HDL 42 04/09/2022   Lab Results  Component Value Date   LDLCALC 68 04/09/2022   Lab Results  Component Value Date   TRIG 80 04/09/2022   Lab Results  Component Value Date   CHOLHDL 3.0 04/09/2022   Lab Results  Component Value Date   HGBA1C 5.8 (H) 11/20/2022      Assessment & Plan:   Problem List Items Addressed This Visit       Endocrine   Hypothyroidism   Lab Results  Component Value Date   TSH 8.040 (H) 11/20/2022   On Levothyroxine  50 mcg QD Check TSH and free T4 and will decide dose accordingly      Relevant Orders   CMP14+EGFR   TSH + free T4   CBC with Differential/Platelet     Other   Obesity (BMI 30.0-34.9) - Primary   BMI Readings from Last 3 Encounters:  06/01/23 30.18 kg/m  11/20/22 30.25 kg/m  04/16/22 29.86 kg/m   Advised to follow low-carb diet, has tried for the last 1 year Moderate exercise/walking for 30 mins/day for 5 days in a week advised Will encourage in the subsequent visits Discussed medical weight loss options, including benefits and side effects - she agrees to start Wegovy Started with Wegovy-initial BMI: 30.18, plan to increase dose as tolerated      Relevant Medications   Semaglutide-Weight Management 0.25 MG/0.5ML SOAJ   Semaglutide-Weight Management 0.5 MG/0.5ML SOAJ (Start on 06/30/2023)   Semaglutide-Weight  Management 1 MG/0.5ML SOAJ (Start on 07/29/2023)   GAD (generalized anxiety disorder)   Overall well-controlled Hydroxyzine  as needed for anxiety      Prediabetes   Lab Results  Component Value Date   HGBA1C 5.8 (H) 11/20/2022   Advised to follow low carbohydrate diet      Relevant Orders   CMP14+EGFR   Hemoglobin A1c    Other Visit Diagnoses       Encounter for screening mammogram for malignant neoplasm of breast       Relevant Orders   MM 3D SCREENING MAMMOGRAM BILATERAL BREAST       Meds ordered this encounter  Medications   Semaglutide-Weight Management 0.25 MG/0.5ML SOAJ    Sig: Inject 0.25 mg into the skin once a week for 28 days.    Dispense:  2 mL    Refill:  0   Semaglutide-Weight Management 0.5 MG/0.5ML SOAJ    Sig: Inject 0.5 mg into the skin once a week for 28 days.    Dispense:  2 mL    Refill:  0   Semaglutide-Weight Management 1 MG/0.5ML SOAJ    Sig: Inject 1 mg into the skin once a week for 28 days.    Dispense:  2 mL    Refill:  0    Follow-up: Return in about 3 months (around 09/01/2023) for Weight management.    Meldon Sport, MD

## 2023-06-01 NOTE — Patient Instructions (Signed)
 Please start taking once weekly Wegovy 0.25 mg for 4 weeks, then 0.5 mg for 4 weeks and then 1 mg once weekly.  Please continue to take medications as prescribed.  Please continue to follow low carb diet and perform moderate exercise/walking at least 150 mins/week.

## 2023-06-01 NOTE — Assessment & Plan Note (Signed)
 Lab Results  Component Value Date   TSH 8.040 (H) 11/20/2022   On Levothyroxine  50 mcg QD Check TSH and free T4 and will decide dose accordingly

## 2023-06-01 NOTE — Assessment & Plan Note (Signed)
 Lab Results  Component Value Date   HGBA1C 5.8 (H) 11/20/2022   Advised to follow low carbohydrate diet

## 2023-06-02 ENCOUNTER — Ambulatory Visit: Payer: Self-pay | Admitting: Internal Medicine

## 2023-06-02 LAB — CMP14+EGFR
ALT: 12 IU/L (ref 0–32)
AST: 20 IU/L (ref 0–40)
Albumin: 4.4 g/dL (ref 3.8–4.9)
Alkaline Phosphatase: 120 IU/L (ref 44–121)
BUN/Creatinine Ratio: 8 — ABNORMAL LOW (ref 9–23)
BUN: 6 mg/dL (ref 6–24)
Bilirubin Total: 0.4 mg/dL (ref 0.0–1.2)
CO2: 23 mmol/L (ref 20–29)
Calcium: 9.6 mg/dL (ref 8.7–10.2)
Chloride: 105 mmol/L (ref 96–106)
Creatinine, Ser: 0.75 mg/dL (ref 0.57–1.00)
Globulin, Total: 2.8 g/dL (ref 1.5–4.5)
Glucose: 84 mg/dL (ref 70–99)
Potassium: 4.4 mmol/L (ref 3.5–5.2)
Sodium: 142 mmol/L (ref 134–144)
Total Protein: 7.2 g/dL (ref 6.0–8.5)
eGFR: 96 mL/min/{1.73_m2} (ref >59)

## 2023-06-02 LAB — HEMOGLOBIN A1C
Est. average glucose Bld gHb Est-mCnc: 123 mg/dL
Hgb A1c MFr Bld: 5.9 % — ABNORMAL HIGH (ref 4.8–5.6)

## 2023-06-02 LAB — CBC WITH DIFFERENTIAL/PLATELET
Basophils Absolute: 0.1 10*3/uL (ref 0.0–0.2)
Basos: 1 %
EOS (ABSOLUTE): 0.1 10*3/uL (ref 0.0–0.4)
Eos: 1 %
Hematocrit: 41 % (ref 34.0–46.6)
Hemoglobin: 13.1 g/dL (ref 11.1–15.9)
Immature Grans (Abs): 0 10*3/uL (ref 0.0–0.1)
Immature Granulocytes: 0 %
Lymphocytes Absolute: 3.8 10*3/uL — ABNORMAL HIGH (ref 0.7–3.1)
Lymphs: 45 %
MCH: 27.4 pg (ref 26.6–33.0)
MCHC: 32 g/dL (ref 31.5–35.7)
MCV: 86 fL (ref 79–97)
Monocytes Absolute: 0.6 10*3/uL (ref 0.1–0.9)
Monocytes: 7 %
Neutrophils Absolute: 4.1 10*3/uL (ref 1.4–7.0)
Neutrophils: 46 %
Platelets: 299 10*3/uL (ref 150–450)
RBC: 4.78 x10E6/uL (ref 3.77–5.28)
RDW: 14.5 % (ref 11.7–15.4)
WBC: 8.6 10*3/uL (ref 3.4–10.8)

## 2023-06-02 LAB — TSH+FREE T4
Free T4: 1.2 ng/dL (ref 0.82–1.77)
TSH: 9.12 u[IU]/mL — ABNORMAL HIGH (ref 0.450–4.500)

## 2023-06-02 MED ORDER — LEVOTHYROXINE SODIUM 75 MCG PO TABS: 75.0000 ug | ORAL_TABLET | Freq: Every day | ORAL | 1 refills | Status: DC

## 2023-06-02 NOTE — Addendum Note (Signed)
 Addended byCleola Dach on: 06/02/2023 08:22 AM   Modules accepted: Orders

## 2023-06-17 ENCOUNTER — Inpatient Hospital Stay (HOSPITAL_COMMUNITY): Admission: RE | Admit: 2023-06-17 | Source: Ambulatory Visit

## 2023-07-08 ENCOUNTER — Ambulatory Visit: Payer: Self-pay | Admitting: Internal Medicine

## 2023-07-18 ENCOUNTER — Other Ambulatory Visit: Payer: Self-pay | Admitting: Internal Medicine

## 2023-07-18 DIAGNOSIS — E66811 Obesity, class 1: Secondary | ICD-10-CM

## 2023-07-18 DIAGNOSIS — R42 Dizziness and giddiness: Secondary | ICD-10-CM

## 2023-07-20 ENCOUNTER — Other Ambulatory Visit: Payer: Self-pay | Admitting: Internal Medicine

## 2023-07-20 DIAGNOSIS — E66811 Obesity, class 1: Secondary | ICD-10-CM

## 2023-07-20 MED ORDER — SEMAGLUTIDE-WEIGHT MANAGEMENT 1 MG/0.5ML ~~LOC~~ SOAJ: 1.0000 mg | SUBCUTANEOUS | 0 refills | Status: AC

## 2023-09-01 ENCOUNTER — Encounter: Payer: Self-pay | Admitting: Internal Medicine

## 2023-09-01 ENCOUNTER — Ambulatory Visit: Admitting: Internal Medicine

## 2023-09-01 VITALS — BP 133/80 | HR 74 | Ht 66.0 in | Wt 170.6 lb

## 2023-09-01 DIAGNOSIS — E66811 Obesity, class 1: Secondary | ICD-10-CM | POA: Diagnosis not present

## 2023-09-01 DIAGNOSIS — F411 Generalized anxiety disorder: Secondary | ICD-10-CM

## 2023-09-01 DIAGNOSIS — E039 Hypothyroidism, unspecified: Secondary | ICD-10-CM

## 2023-09-01 MED ORDER — WEGOVY 1.7 MG/0.75ML ~~LOC~~ SOAJ: 1.7000 mg | SUBCUTANEOUS | 3 refills | Status: DC

## 2023-09-01 NOTE — Assessment & Plan Note (Signed)
 Overall well-controlled Hydroxyzine  as needed for anxiety

## 2023-09-01 NOTE — Assessment & Plan Note (Addendum)
 Lab Results  Component Value Date   TSH 9.120 (H) 06/01/2023   On Levothyroxine  50 mcg QD, was supposed to get 75 mcg dose from pharmacy in 05/25 Check TSH and free T4 and will decide dose accordingly

## 2023-09-01 NOTE — Assessment & Plan Note (Addendum)
 BMI Readings from Last 3 Encounters:  09/01/23 27.54 kg/m  06/01/23 30.18 kg/m  11/20/22 30.25 kg/m   Advised to follow low-carb diet, has tried for the last 1 year Moderate exercise/walking for 30 mins/day for 5 days in a week advised Will continue to encourage in the subsequent visits On Wegovy -initial BMI: 30.18, plan to increase dose as tolerated, prescribed 1.7 mg QW now

## 2023-09-01 NOTE — Progress Notes (Signed)
 Established Patient Office Visit  Subjective:  Patient ID: Erin Forbes, female    DOB: September 14, 1970  Age: 53 y.o. MRN: [id number]  CC:  Chief Complaint  Patient presents with   Medical Management of Chronic Issues    3 month f/u for weight management     HPI Erin Forbes is a 53 y.o. female with past medical history of hypothyroidism, constipation, seasonal allergies and breast surgery for macromastia who presents for f/u of her chronic medical conditions.  Hypothyroidism: She takes Levothyroxine  regularly, but has old dose of 50 mcg. Her TSH was elevated in 05/25 - 9.120. Denies any throat pain, palpitations, tremors or recent skin or nail changes.   Obesity: She has lost about 17 lbs since the last visit.  She is tolerating Wegovy  well, currently at 1 mg dose.  She is following low-carb diet and small, frequent meals.  She had been trying to lose weight with diet modification for more than a year before starting Wegovy , but has been unsuccessful.  She has joined structured diet and exercise program for the last 6 months as well.    Past Medical History:  Diagnosis Date   Headache 2000    Past Surgical History:  Procedure Laterality Date   BREAST REDUCTION SURGERY Bilateral 10/30/2014   Procedure: BREAST REDUCTION WITH LIPOSUCTION;  Surgeon: Elna Pick, MD;  Location: [facility];  Service: Plastics;  Laterality: Bilateral;   BREAST SURGERY N/A    Phreesia 10/18/2019   CESAREAN SECTION  1995, 2002   two   CESAREAN SECTION N/A    Phreesia 10/18/2019   CHOLECYSTECTOMY     COLONOSCOPY WITH PROPOFOL  N/A 04/16/2022   Procedure: COLONOSCOPY WITH PROPOFOL ;  Surgeon: Eartha Angelia Sieving, MD;  Location: [facility];  Service: Gastroenterology;  Laterality: N/A;  11:45AM;ASA1-2   MASS EXCISION Bilateral 10/30/2014   Procedure: BILATERAL EXCISION ACCESSORY BREAST TISSUE;  Surgeon: Elna Pick, MD;  Location: [facility];  Service:  Plastics;  Laterality: Bilateral;   POLYPECTOMY  04/16/2022   Procedure: POLYPECTOMY INTESTINAL;  Surgeon: Eartha Angelia, Sieving, MD;  Location: [facility];  Service: Gastroenterology;;   REDUCTION MAMMAPLASTY Bilateral     Family History  Problem Relation Age of Onset   Stroke Mother 41   Hypertension Mother    Hypertension Father    Hypertension Sister    Hypertension Brother    Breast cancer Paternal Grandmother     Social History   Socioeconomic History   Marital status: Single    Spouse name: Not on file   Number of children: Not on file   Years of education: Not on file   Highest education level: Not on file  Occupational History    Employer: [employer]  Tobacco Use   Smoking status: Former    Types: Cigarettes   Smokeless tobacco: Never   Tobacco comments:    Quit 19 years ago, occasionally used to smoke 1 cigarette, smoked for about 2 years  Vaping Use   Vaping status: Never Used  Substance and Sexual Activity   Alcohol use: Not Currently    Comment: occ   Drug use: No   Sexual activity: Not Currently    Birth control/protection: None  Other Topics Concern   Not on file  Social History Narrative   Not on file   Social Drivers of Health   Financial Resource Strain: Medium Risk (12/06/2019)   Overall Financial Resource Strain (CARDIA)    Difficulty of  Paying Living Expenses: Somewhat hard  Food Insecurity: Food Insecurity Present (12/06/2019)   Hunger Vital Sign    Worried About Running Out of Food in the Last Year: Sometimes true    Ran Out of Food in the Last Year: Never true  Transportation Needs: No Transportation Needs (12/06/2019)   PRAPARE - Administrator, Civil Service (Medical): No    Lack of Transportation (Non-Medical): No  Physical Activity: Insufficiently Active (12/06/2019)   Exercise Vital Sign    Days of Exercise per Week: 2 days    Minutes of Exercise per Session: 20 min  Stress: No Stress Concern Present  (12/06/2019)   [employer] of Occupational Health - Occupational Stress Questionnaire    Feeling of Stress : Only a little  Social Connections: Unknown (12/06/2019)   Social Connection and Isolation Panel    Frequency of Communication with Friends and Family: Three times a week    Frequency of Social Gatherings with Friends and Family: More than three times a week    Attends Religious Services: Patient declined    Active Member of Clubs or Organizations: Yes    Attends Banker Meetings: 1 to 4 times per year    Marital Status: Never married  Intimate Partner Violence: Not At Risk (12/06/2019)   Humiliation, Afraid, Rape, and Kick questionnaire    Fear of Current or Ex-Partner: No    Emotionally Abused: No    Physically Abused: No    Sexually Abused: No    Outpatient Medications Prior to Visit  Medication Sig Dispense Refill   fluticasone  (FLONASE ) 50 MCG/ACT nasal spray Place 2 sprays into both nostrils daily. 16 g 2   hydrOXYzine  (ATARAX ) 10 MG tablet Take 1 tablet (10 mg total) by mouth 2 (two) times daily as needed for anxiety. 30 tablet 1   levothyroxine  (SYNTHROID ) 50 MCG tablet Take 50 mcg by mouth daily.     meclizine  (ANTIVERT ) 25 MG tablet TAKE 1 TABLET BY MOUTH TWICE DAILY AS NEEDED FOR DIZZINESS 30 tablet 0   Vitamin D , Ergocalciferol , (DRISDOL ) 1.25 MG (50000 UNIT) CAPS capsule Take 1 capsule (50,000 Units total) by mouth every 7 (seven) days. 12 capsule 1   levothyroxine  (SYNTHROID ) 75 MCG tablet Take 1 tablet (75 mcg total) by mouth daily. 90 tablet 1   No facility-administered medications prior to visit.    Allergies  Allergen Reactions   Penicillins Rash   Prednisone  Rash    ROS Review of Systems  Constitutional:  Negative for chills and fever.  HENT:  Negative for congestion and postnasal drip.   Eyes:  Negative for pain and discharge.  Respiratory:  Negative for cough and shortness of breath.   Cardiovascular:  Negative for chest pain  and palpitations.  Gastrointestinal:  Negative for abdominal pain, diarrhea, nausea and vomiting.  Endocrine: Negative for polydipsia and polyuria.  Genitourinary:  Negative for dysuria and hematuria.  Musculoskeletal:  Negative for neck pain and neck stiffness.  Skin:  Negative for rash.  Neurological:  Negative for dizziness and weakness.  Psychiatric/Behavioral:  Negative for agitation and behavioral problems.       Objective:    Physical Exam Vitals reviewed.  Constitutional:      General: She is not in acute distress.    Appearance: She is obese. She is not diaphoretic.  HENT:     Head: Normocephalic and atraumatic.     Nose: No congestion.     Mouth/Throat:     Mouth: Mucous  membranes are moist.     Pharynx: No posterior oropharyngeal erythema.  Eyes:     General: No scleral icterus.    Extraocular Movements: Extraocular movements intact.  Cardiovascular:     Rate and Rhythm: Normal rate and regular rhythm.     Pulses: Normal pulses.     Heart sounds: Normal heart sounds. No murmur heard. Pulmonary:     Breath sounds: Normal breath sounds. No wheezing or rales.  Musculoskeletal:     Cervical back: Neck supple. No tenderness.     Right lower leg: No edema.     Left lower leg: No edema.  Skin:    General: Skin is warm.     Findings: No rash.  Neurological:     General: No focal deficit present.     Mental Status: She is alert and oriented to person, place, and time.     Sensory: No sensory deficit.     Motor: No weakness.  Psychiatric:        Mood and Affect: Mood normal.        Behavior: Behavior normal.     BP 133/80   Pulse 74   Ht 5' 6 (1.676 m)   Wt 170 lb 9.6 oz (77.4 kg)   SpO2 94%   BMI 27.54 kg/m  Wt Readings from Last 3 Encounters:  09/01/23 170 lb 9.6 oz (77.4 kg)  06/01/23 187 lb (84.8 kg)  11/20/22 187 lb 6.4 oz (85 kg)    Lab Results  Component Value Date   TSH 9.120 (H) 06/01/2023   Lab Results  Component Value Date   WBC 8.6  06/01/2023   HGB 13.1 06/01/2023   HCT 41.0 06/01/2023   MCV 86 06/01/2023   PLT 299 06/01/2023   Lab Results  Component Value Date   NA 142 06/01/2023   K 4.4 06/01/2023   CO2 23 06/01/2023   GLUCOSE 84 06/01/2023   BUN 6 06/01/2023   CREATININE 0.75 06/01/2023   BILITOT 0.4 06/01/2023   ALKPHOS 120 06/01/2023   AST 20 06/01/2023   ALT 12 06/01/2023   PROT 7.2 06/01/2023   ALBUMIN 4.4 06/01/2023   CALCIUM 9.6 06/01/2023   ANIONGAP 5 08/28/2018   EGFR 96 06/01/2023   Lab Results  Component Value Date   CHOL 126 04/09/2022   Lab Results  Component Value Date   HDL 42 04/09/2022   Lab Results  Component Value Date   LDLCALC 68 04/09/2022   Lab Results  Component Value Date   TRIG 80 04/09/2022   Lab Results  Component Value Date   CHOLHDL 3.0 04/09/2022   Lab Results  Component Value Date   HGBA1C 5.9 (H) 06/01/2023      Assessment & Plan:   Problem List Items Addressed This Visit       Endocrine   Hypothyroidism   Lab Results  Component Value Date   TSH 9.120 (H) 06/01/2023   On Levothyroxine  50 mcg QD, was supposed to get 75 mcg dose from pharmacy in 05/25 Check TSH and free T4 and will decide dose accordingly      Relevant Medications   levothyroxine  (SYNTHROID ) 50 MCG tablet   Other Relevant Orders   CMP14+EGFR   TSH + free T4     Other   Obesity (BMI 30.0-34.9) - Primary   BMI Readings from Last 3 Encounters:  09/01/23 27.54 kg/m  06/01/23 30.18 kg/m  11/20/22 30.25 kg/m   Advised to follow low-carb diet, has  tried for the last 1 year Moderate exercise/walking for 30 mins/day for 5 days in a week advised Will continue to encourage in the subsequent visits On Wegovy -initial BMI: 30.18, plan to increase dose as tolerated, prescribed 1.7 mg QW now      Relevant Medications   semaglutide -weight management (WEGOVY ) 1.7 MG/0.75ML SOAJ SQ injection   GAD (generalized anxiety disorder)   Overall well-controlled Hydroxyzine  as  needed for anxiety        Meds ordered this encounter  Medications   semaglutide -weight management (WEGOVY ) 1.7 MG/0.75ML SOAJ SQ injection    Sig: Inject 1.7 mg into the skin once a week.    Dispense:  3 mL    Refill:  3    Follow-up: Return in about 4 months (around 01/01/2024) for Hypothyroidism and weight management.    Suzzane MARLA Blanch, MD

## 2023-09-01 NOTE — Patient Instructions (Addendum)
 Please schedule mammogram.  Please start taking Wegovy  1.7 mg as prescribed after completing 1 mg doses.  Please continue to take medications as prescribed.  Please continue to follow low carb diet and perform moderate exercise/walking at least 150 mins/week.

## 2023-09-02 ENCOUNTER — Ambulatory Visit: Payer: Self-pay | Admitting: Internal Medicine

## 2023-09-02 LAB — CMP14+EGFR
ALT: 10 IU/L (ref 0–32)
AST: 14 IU/L (ref 0–40)
Albumin: 4.1 g/dL (ref 3.8–4.9)
Alkaline Phosphatase: 95 IU/L (ref 44–121)
BUN/Creatinine Ratio: 9 (ref 9–23)
BUN: 9 mg/dL (ref 6–24)
Bilirubin Total: 0.4 mg/dL (ref 0.0–1.2)
CO2: 24 mmol/L (ref 20–29)
Calcium: 9.1 mg/dL (ref 8.7–10.2)
Chloride: 105 mmol/L (ref 96–106)
Creatinine, Ser: 1 mg/dL (ref 0.57–1.00)
Globulin, Total: 2.5 g/dL (ref 1.5–4.5)
Glucose: 83 mg/dL (ref 70–99)
Potassium: 4 mmol/L (ref 3.5–5.2)
Sodium: 142 mmol/L (ref 134–144)
Total Protein: 6.6 g/dL (ref 6.0–8.5)
eGFR: 67 mL/min/1.73 (ref >59)

## 2023-09-02 LAB — TSH+FREE T4
Free T4: 1.28 ng/dL (ref 0.82–1.77)
TSH: 3.64 u[IU]/mL (ref 0.450–4.500)

## 2023-09-02 MED ORDER — LEVOTHYROXINE SODIUM 50 MCG PO TABS: 50.0000 ug | ORAL_TABLET | Freq: Every day | ORAL | 1 refills | Status: AC

## 2023-09-02 NOTE — Addendum Note (Signed)
 Addended byBETHA TOBIE DOWNS on: 09/02/2023 08:04 AM   Modules accepted: Orders

## 2023-11-04 ENCOUNTER — Encounter (INDEPENDENT_AMBULATORY_CARE_PROVIDER_SITE_OTHER): Payer: Self-pay | Admitting: Gastroenterology

## 2023-11-10 ENCOUNTER — Ambulatory Visit: Admitting: Internal Medicine

## 2023-11-10 ENCOUNTER — Encounter: Payer: Self-pay | Admitting: Internal Medicine

## 2023-11-10 VITALS — BP 115/74 | HR 80 | Ht 66.0 in | Wt 158.8 lb

## 2023-11-10 DIAGNOSIS — E66811 Obesity, class 1: Secondary | ICD-10-CM

## 2023-11-10 DIAGNOSIS — F411 Generalized anxiety disorder: Secondary | ICD-10-CM | POA: Diagnosis not present

## 2023-11-10 DIAGNOSIS — E039 Hypothyroidism, unspecified: Secondary | ICD-10-CM | POA: Diagnosis not present

## 2023-11-10 DIAGNOSIS — Z1231 Encounter for screening mammogram for malignant neoplasm of breast: Secondary | ICD-10-CM

## 2023-11-10 MED ORDER — WEGOVY 0.5 MG/0.5ML ~~LOC~~ SOAJ: 0.5000 mg | SUBCUTANEOUS | 3 refills | Status: AC

## 2023-11-10 NOTE — Assessment & Plan Note (Signed)
 Overall well-controlled Hydroxyzine  as needed for anxiety

## 2023-11-10 NOTE — Progress Notes (Signed)
 Established Patient Office Visit  Subjective:  Patient ID: Erin Forbes, female    DOB: 1970/02/27  Age: 53 y.o. MRN: [id number]  CC:  Chief Complaint  Patient presents with   Medical Management of Chronic Issues    Would like to discuss medication change for wegovy . Last dose was too strong.     HPI Erin Forbes is a 53 y.o. female with past medical history of hypothyroidism, constipation, seasonal allergies and breast surgery for macromastia who presents for f/u of her chronic medical conditions.  Hypothyroidism: She takes Levothyroxine  regularly. Her TSH was WNL in 08/25. Denies any throat pain, palpitations, tremors or recent skin or nail changes.   Obesity: She has lost about 12 lbs since the last visit. She is currently at 1.7 mg dose of Wegovy , but has frequent nausea, vomiting and epigastric pain.  She was tolerating Wegovy  1 mg well, but has not taken Wegovy  for the last 3 weeks.  She is following low-carb diet and small, frequent meals.  She had been trying to lose weight with diet modification for more than a year before starting Wegovy , but has been unsuccessful.  She has joined structured diet and exercise program for the last 9 months as well.    Past Medical History:  Diagnosis Date   Headache 2000    Past Surgical History:  Procedure Laterality Date   BREAST REDUCTION SURGERY Bilateral 10/30/2014   Procedure: BREAST REDUCTION WITH LIPOSUCTION;  Surgeon: Elna Pick, MD;  Location: [facility];  Service: Plastics;  Laterality: Bilateral;   BREAST SURGERY N/A    [facility]reesia 10/18/2019   CESAREAN SECTION  1995, 2002   two   CESAREAN SECTION N/A    Phreesia 10/18/2019   CHOLECYSTECTOMY     COLONOSCOPY WITH PROPOFOL  N/A 04/16/2022   Procedure: COLONOSCOPY WITH PROPOFOL ;  Surgeon: Eartha Angelia Sieving, MD;  Location: [facility];  Service: Gastroenterology;  Laterality: N/A;  11:45AM;ASA1-2   MASS EXCISION Bilateral 10/30/2014    Procedure: BILATERAL EXCISION ACCESSORY BREAST TISSUE;  Surgeon: Elna Pick, MD;  Location: [facility];  Service: Plastics;  Laterality: Bilateral;   POLYPECTOMY  04/16/2022   Procedure: POLYPECTOMY INTESTINAL;  Surgeon: Eartha Angelia, Sieving, MD;  Location: [facility];  Service: Gastroenterology;;   REDUCTION MAMMAPLASTY Bilateral     Family History  Problem Relation Age of Onset   Stroke Mother 39   Hypertension Mother    Hypertension Father    Hypertension Sister    Hypertension Brother    Breast cancer Paternal Grandmother     Social History   Socioeconomic History   Marital status: Single    Spouse name: Not on file   Number of children: Not on file   Years of education: Not on file   Highest education level: Not on file  Occupational History    Employer: [employer]  Tobacco Use   Smoking status: Former    Types: Cigarettes   Smokeless tobacco: Never   Tobacco comments:    Quit 19 years ago, occasionally used to smoke 1 cigarette, smoked for about 2 years  Vaping Use   Vaping status: Never Used  Substance and Sexual Activity   Alcohol use: Not Currently    Comment: occ   Drug use: No   Sexual activity: Not Currently    Birth control/protection: None  Other Topics Concern   Not on file  Social History Narrative   Not on file   Social Drivers  of Health   Financial Resource Strain: Medium Risk (12/06/2019)   Overall Financial Resource Strain (CARDIA)    Difficulty of Paying Living Expenses: Somewhat hard  Food Insecurity: Food Insecurity Present (12/06/2019)   Hunger Vital Sign    Worried About Running Out of Food in the Last Year: Sometimes true    Ran Out of Food in the Last Year: Never true  Transportation Needs: No Transportation Needs (12/06/2019)   PRAPARE - Administrator, Civil Service (Medical): No    Lack of Transportation (Non-Medical): No  Physical Activity: Insufficiently Active (12/06/2019)    Exercise Vital Sign    Days of Exercise per Week: 2 days    Minutes of Exercise per Session: 20 min  Stress: No Stress Concern Present (12/06/2019)   [employer] of Occupational Health - Occupational Stress Questionnaire    Feeling of Stress : Only a little  Social Connections: Unknown (12/06/2019)   Social Connection and Isolation Panel    Frequency of Communication with Friends and Family: Three times a week    Frequency of Social Gatherings with Friends and Family: More than three times a week    Attends Religious Services: Patient declined    Active Member of Clubs or Organizations: Yes    Attends Banker Meetings: 1 to 4 times per year    Marital Status: Never married  Intimate Partner Violence: Not At Risk (12/06/2019)   Humiliation, Afraid, Rape, and Kick questionnaire    Fear of Current or Ex-Partner: No    Emotionally Abused: No    Physically Abused: No    Sexually Abused: No    Outpatient Medications Prior to Visit  Medication Sig Dispense Refill   fluticasone  (FLONASE ) 50 MCG/ACT nasal spray Place 2 sprays into both nostrils daily. 16 g 2   hydrOXYzine  (ATARAX ) 10 MG tablet Take 1 tablet (10 mg total) by mouth 2 (two) times daily as needed for anxiety. 30 tablet 1   levothyroxine  (SYNTHROID ) 50 MCG tablet Take 1 tablet (50 mcg total) by mouth daily. 90 tablet 1   meclizine  (ANTIVERT ) 25 MG tablet TAKE 1 TABLET BY MOUTH TWICE DAILY AS NEEDED FOR DIZZINESS 30 tablet 0   Vitamin D , Ergocalciferol , (DRISDOL ) 1.25 MG (50000 UNIT) CAPS capsule Take 1 capsule (50,000 Units total) by mouth every 7 (seven) days. 12 capsule 1   semaglutide -weight management (WEGOVY ) 1.7 MG/0.75ML SOAJ SQ injection Inject 1.7 mg into the skin once a week. (Patient not taking: Reported on 11/10/2023) 3 mL 3   No facility-administered medications prior to visit.    Allergies  Allergen Reactions   Penicillins Rash   Prednisone  Rash    ROS Review of Systems  Constitutional:   Negative for chills and fever.  HENT:  Negative for congestion and postnasal drip.   Eyes:  Negative for pain and discharge.  Respiratory:  Negative for cough and shortness of breath.   Cardiovascular:  Negative for chest pain and palpitations.  Gastrointestinal:  Positive for abdominal pain, nausea and vomiting. Negative for diarrhea.  Endocrine: Negative for polydipsia and polyuria.  Genitourinary:  Negative for dysuria and hematuria.  Musculoskeletal:  Negative for neck pain and neck stiffness.  Skin:  Negative for rash.  Neurological:  Negative for dizziness and weakness.  Psychiatric/Behavioral:  Negative for agitation and behavioral problems.       Objective:    Physical Exam Vitals reviewed.  Constitutional:      General: She is not in acute distress.  Appearance: She is obese. She is not diaphoretic.  HENT:     Head: Normocephalic and atraumatic.     Nose: No congestion.     Mouth/Throat:     Mouth: Mucous membranes are moist.     Pharynx: No posterior oropharyngeal erythema.  Eyes:     General: No scleral icterus.    Extraocular Movements: Extraocular movements intact.  Cardiovascular:     Rate and Rhythm: Normal rate and regular rhythm.     Heart sounds: Normal heart sounds. No murmur heard. Pulmonary:     Breath sounds: Normal breath sounds. No wheezing or rales.  Musculoskeletal:     Cervical back: Neck supple. No tenderness.     Right lower leg: No edema.     Left lower leg: No edema.  Skin:    General: Skin is warm.     Findings: No rash.  Neurological:     General: No focal deficit present.     Mental Status: She is alert and oriented to person, place, and time.     Sensory: No sensory deficit.     Motor: No weakness.  Psychiatric:        Mood and Affect: Mood normal.        Behavior: Behavior normal.     BP 115/74   Pulse 80   Ht 5' 6 (1.676 m)   Wt 158 lb 12.8 oz (72 kg)   SpO2 100%   BMI 25.63 kg/m  Wt Readings from Last 3 Encounters:   11/10/23 158 lb 12.8 oz (72 kg)  09/01/23 170 lb 9.6 oz (77.4 kg)  06/01/23 187 lb (84.8 kg)    Lab Results  Component Value Date   TSH 3.640 09/01/2023   Lab Results  Component Value Date   WBC 8.6 06/01/2023   HGB 13.1 06/01/2023   HCT 41.0 06/01/2023   MCV 86 06/01/2023   PLT 299 06/01/2023   Lab Results  Component Value Date   NA 142 09/01/2023   K 4.0 09/01/2023   CO2 24 09/01/2023   GLUCOSE 83 09/01/2023   BUN 9 09/01/2023   CREATININE 1.00 09/01/2023   BILITOT 0.4 09/01/2023   ALKPHOS 95 09/01/2023   AST 14 09/01/2023   ALT 10 09/01/2023   PROT 6.6 09/01/2023   ALBUMIN 4.1 09/01/2023   CALCIUM 9.1 09/01/2023   ANIONGAP 5 08/28/2018   EGFR 67 09/01/2023   Lab Results  Component Value Date   CHOL 126 04/09/2022   Lab Results  Component Value Date   HDL 42 04/09/2022   Lab Results  Component Value Date   LDLCALC 68 04/09/2022   Lab Results  Component Value Date   TRIG 80 04/09/2022   Lab Results  Component Value Date   CHOLHDL 3.0 04/09/2022   Lab Results  Component Value Date   HGBA1C 5.9 (H) 06/01/2023      Assessment & Plan:   Problem List Items Addressed This Visit       Endocrine   Hypothyroidism   Lab Results  Component Value Date   TSH 3.640 09/01/2023   On Levothyroxine  50 mcg QD Check TSH and free T4 and will decide dose accordingly      Relevant Orders   TSH + free T4   CMP14+EGFR     Other   Obesity (BMI 30.0-34.9) - Primary   BMI Readings from Last 3 Encounters:  11/10/23 25.63 kg/m  09/01/23 27.54 kg/m  06/01/23 30.18 kg/m   Advised to  follow low-carb diet, had tried for the last 1 year prior to starting Wegovy  Moderate exercise/walking for 30 mins/day for 5 days in a week advised Will continue to encourage in the subsequent visits On Wegovy -initial BMI: 30.18, had increased dose as tolerated, did not tolerate 1.7 mg QW dose Since she has not taken Wegovy  for the last 3 weeks, will restart at 0.5 mg QW  dose      Relevant Medications   semaglutide -weight management (WEGOVY ) 0.5 MG/0.5ML SOAJ SQ injection   GAD (generalized anxiety disorder)   Overall well-controlled Hydroxyzine  as needed for anxiety      Other Visit Diagnoses       Breast cancer screening by mammogram       Relevant Orders   MM 3D SCREENING MAMMOGRAM BILATERAL BREAST         Meds ordered this encounter  Medications   semaglutide -weight management (WEGOVY ) 0.5 MG/0.5ML SOAJ SQ injection    Sig: Inject 0.5 mg into the skin every 7 (seven) days.    Dispense:  2 mL    Refill:  3    Follow-up: Return in about 2 months (around 01/10/2024).    Erin MARLA Blanch, MD

## 2023-11-10 NOTE — Assessment & Plan Note (Signed)
 Lab Results  Component Value Date   TSH 3.640 09/01/2023   On Levothyroxine  50 mcg QD Check TSH and free T4 and will decide dose accordingly

## 2023-11-10 NOTE — Assessment & Plan Note (Addendum)
 BMI Readings from Last 3 Encounters:  11/10/23 25.63 kg/m  09/01/23 27.54 kg/m  06/01/23 30.18 kg/m   Advised to follow low-carb diet, had tried for the last 1 year prior to starting Wegovy  Moderate exercise/walking for 30 mins/day for 5 days in a week advised Will continue to encourage in the subsequent visits On Wegovy -initial BMI: 30.18, had increased dose as tolerated, did not tolerate 1.7 mg QW dose Since she has not taken Wegovy  for the last 3 weeks, will restart at 0.5 mg QW dose

## 2023-11-10 NOTE — Patient Instructions (Addendum)
 Please schedule mammogram.  Please start taking Wegovy  0.5 mg once weekly.  Please continue to take medications as prescribed.  Please continue to follow low carb diet and perform moderate exercise/walking at least 150 mins/week.

## 2023-12-10 ENCOUNTER — Ambulatory Visit: Admitting: Internal Medicine

## 2023-12-29 ENCOUNTER — Other Ambulatory Visit (HOSPITAL_COMMUNITY): Payer: Self-pay

## 2024-01-01 DIAGNOSIS — E039 Hypothyroidism, unspecified: Secondary | ICD-10-CM | POA: Diagnosis not present

## 2024-01-02 LAB — CMP14+EGFR
ALT: 7 IU/L (ref 0–32)
AST: 14 IU/L (ref 0–40)
Albumin: 4.3 g/dL (ref 3.8–4.9)
Alkaline Phosphatase: 104 IU/L (ref 49–135)
BUN/Creatinine Ratio: 7 — ABNORMAL LOW (ref 9–23)
BUN: 6 mg/dL (ref 6–24)
Bilirubin Total: 0.4 mg/dL (ref 0.0–1.2)
CO2: 24 mmol/L (ref 20–29)
Calcium: 9.8 mg/dL (ref 8.7–10.2)
Chloride: 106 mmol/L (ref 96–106)
Creatinine, Ser: 0.82 mg/dL (ref 0.57–1.00)
Globulin, Total: 2.7 g/dL (ref 1.5–4.5)
Glucose: 80 mg/dL (ref 70–99)
Potassium: 4.2 mmol/L (ref 3.5–5.2)
Sodium: 140 mmol/L (ref 134–144)
Total Protein: 7 g/dL (ref 6.0–8.5)
eGFR: 85 mL/min/1.73 (ref >59)

## 2024-01-02 LAB — TSH+FREE T4
Free T4: 1.44 ng/dL (ref 0.82–1.77)
TSH: 4.08 u[IU]/mL (ref 0.450–4.500)

## 2024-01-04 ENCOUNTER — Encounter: Payer: Self-pay | Admitting: Internal Medicine

## 2024-01-04 ENCOUNTER — Other Ambulatory Visit (HOSPITAL_COMMUNITY): Payer: Self-pay

## 2024-01-04 ENCOUNTER — Ambulatory Visit: Admitting: Internal Medicine

## 2024-01-04 VITALS — BP 124/80 | HR 62 | Ht 66.0 in | Wt 159.0 lb

## 2024-01-04 DIAGNOSIS — F411 Generalized anxiety disorder: Secondary | ICD-10-CM | POA: Diagnosis not present

## 2024-01-04 DIAGNOSIS — E66811 Obesity, class 1: Secondary | ICD-10-CM | POA: Diagnosis not present

## 2024-01-04 DIAGNOSIS — Z2821 Immunization not carried out because of patient refusal: Secondary | ICD-10-CM | POA: Diagnosis not present

## 2024-01-04 DIAGNOSIS — E785 Hyperlipidemia, unspecified: Secondary | ICD-10-CM | POA: Insufficient documentation

## 2024-01-04 DIAGNOSIS — E559 Vitamin D deficiency, unspecified: Secondary | ICD-10-CM | POA: Diagnosis not present

## 2024-01-04 DIAGNOSIS — E039 Hypothyroidism, unspecified: Secondary | ICD-10-CM | POA: Diagnosis not present

## 2024-01-04 DIAGNOSIS — Z0001 Encounter for general adult medical examination with abnormal findings: Secondary | ICD-10-CM | POA: Diagnosis not present

## 2024-01-04 DIAGNOSIS — R7303 Prediabetes: Secondary | ICD-10-CM

## 2024-01-04 MED ORDER — VITAMIN D (ERGOCALCIFEROL) 1.25 MG (50000 UNIT) PO CAPS: 50000.0000 [IU] | ORAL_CAPSULE | ORAL | 1 refills | Status: AC

## 2024-01-04 NOTE — Progress Notes (Signed)
 Established Patient Office Visit  Subjective:  Patient ID: Erin Forbes, female    DOB: 1970/12/04  Age: 53 y.o. MRN: [id number]  CC:  Chief Complaint  Patient presents with   Obesity    Four month follow up   Hypothyroidism    Four month follow up    Annual Exam    HPI Erin Forbes is a 53 y.o. female with past medical history of hypothyroidism, constipation, seasonal allergies and breast surgery for macromastia who presents for f/u of her chronic medical conditions.  Hypothyroidism: She takes Levothyroxine  50 mcg QD regularly. Her TSH is WNL. Denies any throat pain, palpitations, tremors or recent skin or nail changes.   Obesity: Her weight is stable since the last visit. She is currently at 0.5 mg dose of Wegovy , tolerates it better. She had frequent nausea, vomiting and epigastric pain with higher dose of Wegovy  and had to stop it before decreasing the dose.  She is following low-carb diet and small, frequent meals.  She had been trying to lose weight with diet modification for more than a year before starting Wegovy , but has been unsuccessful.  She has joined structured diet and exercise program for the last 1 year.    Past Medical History:  Diagnosis Date   Headache 2000    Past Surgical History:  Procedure Laterality Date   BREAST REDUCTION SURGERY Bilateral 10/30/2014   Procedure: BREAST REDUCTION WITH LIPOSUCTION;  Surgeon: Elna Pick, MD;  Location: [facility];  Service: Plastics;  Laterality: Bilateral;   BREAST SURGERY N/A    Phreesia 10/18/2019   CESAREAN SECTION  1995, 2002   two   CESAREAN SECTION N/A    Phreesia 10/18/2019   CHOLECYSTECTOMY     COLONOSCOPY WITH PROPOFOL  N/A 04/16/2022   Procedure: COLONOSCOPY WITH PROPOFOL ;  Surgeon: Eartha Angelia Sieving, MD;  [facility];  Service: Gastroenterology;  Laterality: N/A;  11:45AM;ASA1-2   MASS EXCISION Bilateral 10/30/2014   Procedure: BILATERAL EXCISION ACCESSORY  BREAST TISSUE;  Surgeon: Elna Pick, MD;  Location: [facility];  Service: Plastics;  Laterality: Bilateral;   POLYPECTOMY  04/16/2022   Procedure: POLYPECTOMY INTESTINAL;  Surgeon: Eartha Angelia, Sieving, MD;  Location: [facility];  Service: Gastroenterology;;   REDUCTION MAMMAPLASTY Bilateral     Family History  Problem Relation Age of Onset   Stroke Mother 60   Hypertension Mother    Hypertension Father    Hypertension Sister    Hypertension Brother    Breast cancer Paternal Grandmother     Social History   Socioeconomic History   Marital status: Single    Spouse name: Not on file   Number of children: Not on file   Years of education: Not on file   Highest education level: Not on file  Occupational History    Employer: [employer]  Tobacco Use   Smoking status: Former    Types: Cigarettes   Smokeless tobacco: Never   Tobacco comments:    Quit 19 years ago, occasionally used to smoke 1 cigarette, smoked for about 2 years  Vaping Use   Vaping status: Never Used  Substance and Sexual Activity   Alcohol use: Not Currently    Comment: occ   Drug use: No   Sexual activity: Not Currently    Birth control/protection: None  Other Topics Concern   Not on file  Social History Narrative   Not on file   Social Drivers of Health  Tobacco Use: Medium Risk (01/04/2024)   Patient History    Smoking Tobacco Use: Former    Smokeless Tobacco Use: Never    Passive Exposure: Not on Actuary Strain: Not on file  Food Insecurity: Not on file  Transportation Needs: Not on file  Physical Activity: Not on file  Stress: Not on file  Social Connections: Not on file  Intimate Partner Violence: Not on file  Depression (PHQ2-9): Low Risk (11/10/2023)   Depression (PHQ2-9)    PHQ-2 Score: 0  Alcohol Screen: Not on file  Housing: Not on file  Utilities: Not on file  Health Literacy: Not on file    Outpatient Medications Prior to  Visit  Medication Sig Dispense Refill   fluticasone  (FLONASE ) 50 MCG/ACT nasal spray Place 2 sprays into both nostrils daily. 16 g 2   hydrOXYzine  (ATARAX ) 10 MG tablet Take 1 tablet (10 mg total) by mouth 2 (two) times daily as needed for anxiety. 30 tablet 1   levothyroxine  (SYNTHROID ) 50 MCG tablet Take 1 tablet (50 mcg total) by mouth daily. 90 tablet 1   meclizine  (ANTIVERT ) 25 MG tablet TAKE 1 TABLET BY MOUTH TWICE DAILY AS NEEDED FOR DIZZINESS 30 tablet 0   semaglutide -weight management (WEGOVY ) 0.5 MG/0.5ML SOAJ SQ injection Inject 0.5 mg into the skin every 7 (seven) days. 2 mL 3   Vitamin D , Ergocalciferol , (DRISDOL ) 1.25 MG (50000 UNIT) CAPS capsule Take 1 capsule (50,000 Units total) by mouth every 7 (seven) days. 12 capsule 1   No facility-administered medications prior to visit.    Allergies  Allergen Reactions   Penicillins Rash   Prednisone  Rash    ROS Review of Systems  Constitutional:  Negative for chills and fever.  HENT:  Negative for congestion and postnasal drip.   Eyes:  Negative for pain and discharge.  Respiratory:  Negative for cough and shortness of breath.   Cardiovascular:  Negative for chest pain and palpitations.  Gastrointestinal:  Positive for nausea. Negative for diarrhea and vomiting.  Endocrine: Negative for polydipsia and polyuria.  Genitourinary:  Negative for dysuria and hematuria.  Musculoskeletal:  Negative for neck pain and neck stiffness.  Skin:  Negative for rash.  Neurological:  Negative for dizziness and weakness.  Psychiatric/Behavioral:  Negative for agitation and behavioral problems.       Objective:    Physical Exam Vitals reviewed.  Constitutional:      General: She is not in acute distress.    Appearance: She is obese. She is not diaphoretic.  HENT:     Head: Normocephalic and atraumatic.     Nose: No congestion.     Mouth/Throat:     Mouth: Mucous membranes are moist.     Pharynx: No posterior oropharyngeal erythema.   Eyes:     General: No scleral icterus.    Extraocular Movements: Extraocular movements intact.  Cardiovascular:     Rate and Rhythm: Normal rate and regular rhythm.     Heart sounds: Normal heart sounds. No murmur heard. Pulmonary:     Breath sounds: Normal breath sounds. No wheezing or rales.  Abdominal:     Palpations: Abdomen is soft.     Tenderness: There is no abdominal tenderness.  Musculoskeletal:     Cervical back: Neck supple. No tenderness.     Right lower leg: No edema.     Left lower leg: No edema.  Skin:    General: Skin is warm.     Findings: No rash.  Neurological:     General: No focal deficit present.     Mental Status: She is alert and oriented to person, place, and time.     Cranial Nerves: No cranial nerve deficit.     Sensory: No sensory deficit.     Motor: No weakness.  Psychiatric:        Mood and Affect: Mood normal.        Behavior: Behavior normal.     BP 124/80   Pulse 62   Ht 5' 6 (1.676 m)   Wt 159 lb (72.1 kg)   SpO2 100%   BMI 25.66 kg/m  Wt Readings from Last 3 Encounters:  01/04/24 159 lb (72.1 kg)  11/10/23 158 lb 12.8 oz (72 kg)  09/01/23 170 lb 9.6 oz (77.4 kg)    Lab Results  Component Value Date   TSH 4.080 01/01/2024   Lab Results  Component Value Date   WBC 8.6 06/01/2023   HGB 13.1 06/01/2023   HCT 41.0 06/01/2023   MCV 86 06/01/2023   PLT 299 06/01/2023   Lab Results  Component Value Date   NA 140 01/01/2024   K 4.2 01/01/2024   CO2 24 01/01/2024   GLUCOSE 80 01/01/2024   BUN 6 01/01/2024   CREATININE 0.82 01/01/2024   BILITOT 0.4 01/01/2024   ALKPHOS 104 01/01/2024   AST 14 01/01/2024   ALT 7 01/01/2024   PROT 7.0 01/01/2024   ALBUMIN 4.3 01/01/2024   CALCIUM 9.8 01/01/2024   ANIONGAP 5 08/28/2018   EGFR 85 01/01/2024   Lab Results  Component Value Date   CHOL 126 04/09/2022   Lab Results  Component Value Date   HDL 42 04/09/2022   Lab Results  Component Value Date   LDLCALC 68 04/09/2022    Lab Results  Component Value Date   TRIG 80 04/09/2022   Lab Results  Component Value Date   CHOLHDL 3.0 04/09/2022   Lab Results  Component Value Date   HGBA1C 5.9 (H) 06/01/2023      Assessment & Plan:   Problem List Items Addressed This Visit       Endocrine   Hypothyroidism   Lab Results  Component Value Date   TSH 4.080 01/01/2024   On Levothyroxine  50 mcg QD      Relevant Orders   CMP14+EGFR   TSH + free T4   CBC with Differential/Platelet     Other   Obesity (BMI 30.0-34.9)   BMI Readings from Last 3 Encounters:  01/04/24 25.66 kg/m  11/10/23 25.63 kg/m  09/01/23 27.54 kg/m   Advised to follow low-carb diet, had tried for the last 1 year prior to starting Wegovy  Moderate exercise/walking for 30 mins/day for 5 days in a week advised Will continue to encourage in the subsequent visits On Wegovy -initial BMI: 30.18, had increased dose as tolerated, did not tolerate 1.7 mg QW dose Continue Wegovy  0.5 mg QW dose as she tolerates it better      GAD (generalized anxiety disorder)   Overall well-controlled Hydroxyzine  as needed for anxiety      Relevant Orders   CBC with Differential/Platelet   Vitamin D  deficiency   Advised to take vitamin D  50,000 IU QW Check vitamin D  level      Relevant Medications   Vitamin D , Ergocalciferol , (DRISDOL ) 1.25 MG (50000 UNIT) CAPS capsule   Other Relevant Orders   Vitamin D  (25 hydroxy)   Prediabetes   Lab Results  Component Value Date  HGBA1C 5.9 (H) 06/01/2023   Advised to follow low carbohydrate diet      Relevant Orders   Hemoglobin A1c   Hyperlipidemia   Check lipid profile Advised to follow low-carb diet      Relevant Orders   Lipid Profile   Encounter for general adult medical examination with abnormal findings - Primary   Physical exam as documented. Counseling done  re healthy lifestyle involving commitment to 150 minutes exercise per week, heart healthy diet, and attaining healthy  weight.The importance of adequate sleep also discussed. Changes in health habits are decided on by the patient with goals and time frames  set for achieving them. Immunization and cancer screening needs are specifically addressed at this visit.      Other Visit Diagnoses       Refused pneumococcal vaccination              Meds ordered this encounter  Medications   Vitamin D , Ergocalciferol , (DRISDOL ) 1.25 MG (50000 UNIT) CAPS capsule    Sig: Take 1 capsule (50,000 Units total) by mouth every 7 (seven) days.    Dispense:  12 capsule    Refill:  1    Follow-up: Return in about 4 months (around 05/04/2024) for Hypothyroidism and weight management.    Suzzane MARLA Blanch, MD

## 2024-01-04 NOTE — Assessment & Plan Note (Signed)
Check lipid profile Advised to follow low carb diet

## 2024-01-04 NOTE — Assessment & Plan Note (Signed)
 Overall well-controlled Hydroxyzine  as needed for anxiety

## 2024-01-04 NOTE — Assessment & Plan Note (Signed)
 BMI Readings from Last 3 Encounters:  01/04/24 25.66 kg/m  11/10/23 25.63 kg/m  09/01/23 27.54 kg/m   Advised to follow low-carb diet, had tried for the last 1 year prior to starting Wegovy  Moderate exercise/walking for 30 mins/day for 5 days in a week advised Will continue to encourage in the subsequent visits On Wegovy -initial BMI: 30.18, had increased dose as tolerated, did not tolerate 1.7 mg QW dose Continue Wegovy  0.5 mg QW dose as she tolerates it better

## 2024-01-04 NOTE — Assessment & Plan Note (Addendum)
 Lab Results  Component Value Date   TSH 4.080 01/01/2024   On Levothyroxine  50 mcg QD

## 2024-01-04 NOTE — Assessment & Plan Note (Signed)

## 2024-01-04 NOTE — Assessment & Plan Note (Addendum)
 Advised to take vitamin D  50,000 IU QW Check vitamin D  level

## 2024-01-04 NOTE — Patient Instructions (Signed)
 Please start taking Vitamin D  50000 IU once weekly.  Please continue to take medications as prescribed.  Please continue to follow low carb diet and perform moderate exercise/walking at least 150 mins/week.  Please get fasting blood tests done before the next visit.

## 2024-01-04 NOTE — Assessment & Plan Note (Signed)
 Lab Results  Component Value Date   HGBA1C 5.9 (H) 06/01/2023   Advised to follow low carbohydrate diet

## 2024-01-25 ENCOUNTER — Other Ambulatory Visit (HOSPITAL_COMMUNITY): Payer: Self-pay

## 2024-01-25 ENCOUNTER — Telehealth: Payer: Self-pay | Admitting: Pharmacy Technician

## 2024-01-25 NOTE — Telephone Encounter (Signed)
 Pharmacy Patient Advocate Encounter  Received notification from [facility] that Prior Authorization for Wegovy  0.5mg  has been APPROVED from 01/25/24 to 01/24/25. Ran test claim, Copay is $45. This test claim was processed through [facility]- copay amounts may vary at other pharmacies due to pharmacy/plan contracts, or as the patient moves through the different stages of their insurance plan.   PA #/Case ID/Reference #: [id number]

## 2024-01-25 NOTE — Telephone Encounter (Signed)
 Pharmacy Patient Advocate Encounter   Received notification from Onbase that prior authorization for Wegovy  0.5mg  is required/requested.   Insurance verification completed.   The patient is insured through [facility].   Per test claim: PA required; PA submitted to above mentioned insurance via Latent Key/confirmation #/EOC [id number] Status is pending

## 2024-02-18 ENCOUNTER — Telehealth: Admitting: Physician Assistant

## 2024-02-18 DIAGNOSIS — S0993XA Unspecified injury of face, initial encounter: Secondary | ICD-10-CM

## 2024-02-18 DIAGNOSIS — S025XXA Fracture of tooth (traumatic), initial encounter for closed fracture: Secondary | ICD-10-CM

## 2024-02-18 DIAGNOSIS — W19XXXA Unspecified fall, initial encounter: Secondary | ICD-10-CM

## 2024-02-19 ENCOUNTER — Ambulatory Visit: Payer: Self-pay

## 2024-02-19 ENCOUNTER — Emergency Department (HOSPITAL_COMMUNITY)

## 2024-02-19 ENCOUNTER — Encounter (HOSPITAL_COMMUNITY): Payer: Self-pay

## 2024-02-19 ENCOUNTER — Emergency Department (HOSPITAL_COMMUNITY)
Admission: EM | Admit: 2024-02-19 | Discharge: 2024-02-19 | Disposition: A | Discharge status: Home / Self Care | Attending: Emergency Medicine | Admitting: Emergency Medicine

## 2024-02-19 ENCOUNTER — Other Ambulatory Visit: Payer: Self-pay

## 2024-02-19 DIAGNOSIS — S025XXA Fracture of tooth (traumatic), initial encounter for closed fracture: Secondary | ICD-10-CM | POA: Insufficient documentation

## 2024-02-19 DIAGNOSIS — K029 Dental caries, unspecified: Secondary | ICD-10-CM | POA: Diagnosis not present

## 2024-02-19 DIAGNOSIS — W228XXA Striking against or struck by other objects, initial encounter: Secondary | ICD-10-CM | POA: Insufficient documentation

## 2024-02-19 DIAGNOSIS — Y93B1 Activity, exercise machines primarily for muscle strengthening: Secondary | ICD-10-CM | POA: Insufficient documentation

## 2024-02-19 DIAGNOSIS — S0993XA Unspecified injury of face, initial encounter: Secondary | ICD-10-CM

## 2024-02-19 LAB — CBC WITH DIFFERENTIAL/PLATELET
Abs Immature Granulocytes: 0.02 10*3/uL (ref 0.00–0.07)
Basophils Absolute: 0 10*3/uL (ref 0.0–0.1)
Basophils Relative: 0 %
Eosinophils Absolute: 0.1 10*3/uL (ref 0.0–0.5)
Eosinophils Relative: 1 %
HCT: 37.4 % (ref 36.0–46.0)
Hemoglobin: 11.9 g/dL — ABNORMAL LOW (ref 12.0–15.0)
Immature Granulocytes: 0 %
Lymphocytes Relative: 27 %
Lymphs Abs: 2.6 10*3/uL (ref 0.7–4.0)
MCH: 27.5 pg (ref 26.0–34.0)
MCHC: 31.8 g/dL (ref 30.0–36.0)
MCV: 86.4 fL (ref 80.0–100.0)
Monocytes Absolute: 0.8 10*3/uL (ref 0.1–1.0)
Monocytes Relative: 8 %
Neutro Abs: 6 10*3/uL (ref 1.7–7.7)
Neutrophils Relative %: 64 %
Platelets: 244 10*3/uL (ref 150–400)
RBC: 4.33 MIL/uL (ref 3.87–5.11)
RDW: 15 % (ref 11.5–15.5)
WBC: 9.5 10*3/uL (ref 4.0–10.5)
nRBC: 0 % (ref 0.0–0.2)

## 2024-02-19 LAB — BASIC METABOLIC PANEL WITH GFR
Anion gap: 13 (ref 5–15)
BUN: 8 mg/dL (ref 6–20)
CO2: 23 mmol/L (ref 22–32)
Calcium: 9.2 mg/dL (ref 8.9–10.3)
Chloride: 108 mmol/L (ref 98–111)
Creatinine, Ser: 0.76 mg/dL (ref 0.44–1.00)
GFR, Estimated: >60 mL/min
Glucose, Bld: 99 mg/dL (ref 70–99)
Potassium: 4.8 mmol/L (ref 3.5–5.1)
Sodium: 144 mmol/L (ref 135–145)

## 2024-02-19 MED ORDER — CLINDAMYCIN HCL 150 MG PO CAPS: 450.0000 mg | ORAL_CAPSULE | Freq: Three times a day (TID) | ORAL | 0 refills | Status: AC

## 2024-02-19 MED ORDER — HYDROMORPHONE HCL 1 MG/ML IJ SOLN
1.0000 mg | Freq: Once | INTRAMUSCULAR | Status: AC
  Administered 2024-02-19: 1 mg via INTRAMUSCULAR
  Filled 2024-02-19: qty 1

## 2024-02-19 MED ORDER — OXYCODONE-ACETAMINOPHEN 5-325 MG PO TABS: 1.0000 | ORAL_TABLET | Freq: Four times a day (QID) | ORAL | 0 refills | Status: AC | PRN

## 2024-02-19 MED ORDER — ONDANSETRON 4 MG PO TBDP
4.0000 mg | ORAL_TABLET | Freq: Once | ORAL | Status: AC
  Administered 2024-02-19: 4 mg via ORAL
  Filled 2024-02-19: qty 1

## 2024-02-19 MED ORDER — OXYCODONE-ACETAMINOPHEN 5-325 MG PO TABS
1.0000 | ORAL_TABLET | Freq: Once | ORAL | Status: AC
  Administered 2024-02-19: 1 via ORAL
  Filled 2024-02-19: qty 1

## 2024-02-19 MED ORDER — KETOROLAC TROMETHAMINE 30 MG/ML IJ SOLN
30.0000 mg | Freq: Once | INTRAMUSCULAR | Status: AC
  Administered 2024-02-19: 30 mg via INTRAMUSCULAR
  Filled 2024-02-19: qty 1

## 2024-02-19 NOTE — Discharge Instructions (Addendum)
 You are being prescribed pain medicine to help you with your symptoms, this does contain a small amount of Tylenol  325 mg and each tablet so use caution with adding any additional Tylenol .  I do recommend continuing to use your ibuprofen  however which will  also offer pain relief but also can help reduce the swelling.  I have prescribed you antibiotics, as I suspect you do have an early infection which is increasing the swelling of your upper lip.  It will be important that you add an over-the-counter probiotic or daily yogurt with active yeast cultures to help prevent diarrhea which is a common side effect of the antibiotic.  Also do not drive within 4 hours of taking the oxycodone  as this is a narcotic and will make you drowsy.  Get rechecked for any worsening symptoms and plan follow-up care with your dentist and oral surgery.

## 2024-02-19 NOTE — ED Notes (Signed)
 Pt reports pain is currently 4/10 and tolerable at this time. Facial swelling notably decreased by patient and clinical research associate. Provider made aware.

## 2024-02-19 NOTE — ED Notes (Signed)
Written and verbal discharge instructions administered. Pt denies any questions or concerns at this time.

## 2024-02-19 NOTE — ED Triage Notes (Signed)
 Pt reports falling last Saturday, hitting front of face and breaking tooth. Pt was seen by dentist on Tuesday and seen oral surgeon on Wednesday. Pt reports she has noted increased swelling and pain since event. Pt reports nausea, no vomiting, no fever, and no foul taste or smell. Pt states she has been taking apap and ibu without relief. Pt tearful at this time. Notable swelling noted to upper lip. Redness and swelling noted to left side of upper, front gumline. Pt denies any recent abx use.
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
## 2024-02-19 NOTE — Telephone Encounter (Signed)
 Pt reported to ER as advised for evaluation
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
## 2024-02-19 NOTE — ED Provider Notes (Signed)
 " [place] EMERGENCY DEPARTMENT AT [facility] Provider Note   CSN: [id number] Arrival date & time: 02/19/24  9065     Patient presents with: Facial Injury   Erin Forbes is a 54 y.o. female.  {Add pertinent medical, surgical, social history, OB history to YEP:67052} The history is provided by the patient.       Prior to Admission medications  Medication Sig Start Date End Date Taking? Authorizing Provider  clindamycin  (CLEOCIN ) 150 MG capsule Take 3 capsules (450 mg total) by mouth 3 (three) times daily for 7 days. 02/19/24 02/26/24 Yes Meztli Llanas, PA-C  oxyCODONE -acetaminophen  (PERCOCET/ROXICET) 5-325 MG tablet Take 1 tablet by mouth every 6 (six) hours as needed for severe pain (pain score 7-10). 02/19/24  Yes Hildy Nicholl, Mliss, PA-C  fluticasone  (FLONASE ) 50 MCG/ACT nasal spray Place 2 sprays into both nostrils daily. 11/20/22   Tobie Suzzane POUR, MD  hydrOXYzine  (ATARAX ) 10 MG tablet Take 1 tablet (10 mg total) by mouth 2 (two) times daily as needed for anxiety. 02/07/22   Tobie Suzzane POUR, MD  levothyroxine  (SYNTHROID ) 50 MCG tablet Take 1 tablet (50 mcg total) by mouth daily. 09/02/23   Tobie Suzzane POUR, MD  meclizine  (ANTIVERT ) 25 MG tablet TAKE 1 TABLET BY MOUTH TWICE DAILY AS NEEDED FOR DIZZINESS 07/20/23   Tobie Suzzane POUR, MD  semaglutide -weight management (WEGOVY ) 0.5 MG/0.5ML SOAJ SQ injection Inject 0.5 mg into the skin every 7 (seven) days. 11/10/23   Tobie Suzzane POUR, MD  Vitamin D , Ergocalciferol , (DRISDOL ) 1.25 MG (50000 UNIT) CAPS capsule Take 1 capsule (50,000 Units total) by mouth every 7 (seven) days. 01/04/24   Tobie Suzzane POUR, MD    Allergies: Penicillins and Prednisone     Review of Systems  Updated Vital Signs BP (!) 148/90 (BP Location: Left Arm)   Pulse 60   Temp 98.2 F (36.8 C) (Oral)   Resp 18   Ht 5' 6 (1.676 m)   Wt 74.8 kg   SpO2 99%   BMI 26.63 kg/m   Physical Exam  (all labs ordered are listed, but only abnormal results are  displayed) Labs Reviewed  CBC WITH DIFFERENTIAL/PLATELET - Abnormal; Notable for the following components:      Result Value   Hemoglobin 11.9 (*)    All other components within normal limits  BASIC METABOLIC PANEL WITH GFR    EKG: None  Radiology: CT Maxillofacial Wo Contrast Result Date: 02/19/2024 EXAM: CT OF THE FACE WITHOUT CONTRAST 02/19/2024 10:57:37 AM TECHNIQUE: CT of the face was performed without the administration of intravenous contrast. Multiplanar reformatted images are provided for review. Automated exposure control, iterative reconstruction, and/or weight based adjustment of the mA/kV was utilized to reduce the radiation dose to as low as reasonably achievable. COMPARISON: None available. CLINICAL HISTORY: Facial trauma, blunt. FINDINGS: FACIAL BONES: The mandible and maxilla are intact. No mandibular dislocation. No suspicious bone lesion. Medial incisors are absent. Fracture through the root of the medial right maxillary incisor. No acute fracture of the right lateral incisor. Dental caries is present in the right maxillary premolar tooth with a periapical lucency. Dental caries is present within the left maxillary canine tooth. ORBITS: Globes are intact. No acute traumatic injury. No inflammatory change. SINUSES AND MASTOIDS: No acute abnormality. SOFT TISSUES: Soft tissue swelling is noted anterior to the maxilla. IMPRESSION: 1. Fracture of the medial right maxillary incisor. 2. Soft tissue swelling anterior to the maxilla. 3. Dental caries in the right maxillary premolar tooth with periapical  lucency and in the left maxillary canine tooth. Electronically signed by: Lonni Necessary MD 02/19/2024 11:08 AM EST RP Workstation: [id number]    {Document cardiac monitor, telemetry assessment procedure when appropriate:32947} Procedures   Medications Ordered in the ED  oxyCODONE -acetaminophen  (PERCOCET/ROXICET) 5-325 MG per tablet 1 tablet (1 tablet Oral Given 02/19/24 1030)   HYDROmorphone  (DILAUDID ) injection 1 mg (1 mg Intramuscular Given 02/19/24 1156)  ondansetron  (ZOFRAN -ODT) disintegrating tablet 4 mg (4 mg Oral Given 02/19/24 1156)  ketorolac  (TORADOL ) 30 MG/ML injection 30 mg (30 mg Intramuscular Given 02/19/24 1156)      {Click here for ABCD2, HEART and other calculators REFRESH Note before signing:1}                              Medical Decision Making Amount and/or Complexity of Data Reviewed Labs: ordered. Radiology: ordered.  Risk Prescription drug management.     {Document critical care time when appropriate  Document review of labs and clinical decision tools ie CHADS2VASC2, etc  Document your independent review of radiology images and any outside records  Document your discussion with family members, caretakers and with consultants  Document social determinants of health affecting pt's care  Document your decision making why or why not admission, treatments were needed:32947:::1}   Final diagnoses:  Dental trauma, initial encounter    ED Discharge Orders          Ordered    oxyCODONE -acetaminophen  (PERCOCET/ROXICET) 5-325 MG tablet  Every 6 hours PRN        02/19/24 1247    clindamycin  (CLEOCIN ) 150 MG capsule  3 times daily        02/19/24 1247             "

## 2024-05-04 ENCOUNTER — Ambulatory Visit: Payer: Self-pay | Admitting: Internal Medicine
